# Patient Record
Sex: Female | Born: 1950 | Hispanic: No | Smoking: Never smoker
Health system: Southern US, Community
[De-identification: ages and names within clinical notes are randomized; demographics above are authoritative.]

## PROBLEM LIST (undated history)

## (undated) DIAGNOSIS — I10 Essential (primary) hypertension: Secondary | ICD-10-CM

## (undated) DIAGNOSIS — M1990 Unspecified osteoarthritis, unspecified site: Secondary | ICD-10-CM

## (undated) DIAGNOSIS — R42 Dizziness and giddiness: Secondary | ICD-10-CM

## (undated) HISTORY — DX: Unspecified osteoarthritis, unspecified site: M19.90

## (undated) HISTORY — DX: Dizziness and giddiness: R42

---

## 1989-07-23 HISTORY — PX: BREAST ENHANCEMENT SURGERY: SHX7

## 1999-10-24 ENCOUNTER — Other Ambulatory Visit: Admission: RE | Admit: 1999-10-24 | Discharge: 1999-10-24 | Payer: Self-pay | Admitting: Sports Medicine

## 1999-10-24 ENCOUNTER — Encounter: Admission: RE | Admit: 1999-10-24 | Discharge: 1999-10-24 | Payer: Self-pay | Admitting: Sports Medicine

## 2000-06-12 ENCOUNTER — Encounter: Admission: RE | Admit: 2000-06-12 | Discharge: 2000-06-12 | Payer: Self-pay | Admitting: Family Medicine

## 2000-11-21 ENCOUNTER — Encounter: Admission: RE | Admit: 2000-11-21 | Discharge: 2000-11-21 | Payer: Self-pay | Admitting: Family Medicine

## 2001-01-08 ENCOUNTER — Encounter: Admission: RE | Admit: 2001-01-08 | Discharge: 2001-01-08 | Payer: Self-pay | Admitting: Sports Medicine

## 2001-01-08 ENCOUNTER — Encounter: Payer: Self-pay | Admitting: Sports Medicine

## 2001-01-09 ENCOUNTER — Encounter: Admission: RE | Admit: 2001-01-09 | Discharge: 2001-01-09 | Payer: Self-pay | Admitting: Family Medicine

## 2001-01-09 ENCOUNTER — Other Ambulatory Visit: Admission: RE | Admit: 2001-01-09 | Discharge: 2001-01-09 | Payer: Self-pay | Admitting: Sports Medicine

## 2001-02-20 ENCOUNTER — Encounter: Admission: RE | Admit: 2001-02-20 | Discharge: 2001-02-20 | Payer: Self-pay | Admitting: Family Medicine

## 2001-09-07 ENCOUNTER — Encounter: Payer: Self-pay | Admitting: *Deleted

## 2001-09-07 ENCOUNTER — Emergency Department (HOSPITAL_COMMUNITY): Admission: EM | Admit: 2001-09-07 | Discharge: 2001-09-07 | Payer: Self-pay | Admitting: Emergency Medicine

## 2002-12-01 ENCOUNTER — Encounter: Admission: RE | Admit: 2002-12-01 | Discharge: 2002-12-01 | Payer: Self-pay | Admitting: Family Medicine

## 2002-12-23 ENCOUNTER — Encounter: Admission: RE | Admit: 2002-12-23 | Discharge: 2002-12-23 | Payer: Self-pay | Admitting: Family Medicine

## 2002-12-29 ENCOUNTER — Ambulatory Visit (HOSPITAL_COMMUNITY): Admission: RE | Admit: 2002-12-29 | Discharge: 2002-12-29 | Payer: Self-pay

## 2002-12-29 ENCOUNTER — Encounter: Payer: Self-pay | Admitting: Family Medicine

## 2004-06-05 ENCOUNTER — Ambulatory Visit: Payer: Self-pay | Admitting: Family Medicine

## 2004-07-03 ENCOUNTER — Ambulatory Visit (HOSPITAL_COMMUNITY): Admission: RE | Admit: 2004-07-03 | Discharge: 2004-07-03 | Payer: Self-pay | Admitting: Family Medicine

## 2004-07-26 ENCOUNTER — Ambulatory Visit: Payer: Self-pay | Admitting: Family Medicine

## 2004-08-29 ENCOUNTER — Ambulatory Visit: Payer: Self-pay | Admitting: Family Medicine

## 2005-09-17 ENCOUNTER — Emergency Department (HOSPITAL_COMMUNITY): Admission: EM | Admit: 2005-09-17 | Discharge: 2005-09-17 | Payer: Self-pay | Admitting: Family Medicine

## 2005-10-30 ENCOUNTER — Ambulatory Visit: Payer: Self-pay | Admitting: Family Medicine

## 2005-11-08 ENCOUNTER — Ambulatory Visit (HOSPITAL_COMMUNITY): Admission: RE | Admit: 2005-11-08 | Discharge: 2005-11-08 | Payer: Self-pay | Admitting: Family Medicine

## 2005-11-26 ENCOUNTER — Ambulatory Visit: Payer: Self-pay | Admitting: Family Medicine

## 2005-11-26 ENCOUNTER — Encounter (INDEPENDENT_AMBULATORY_CARE_PROVIDER_SITE_OTHER): Payer: Self-pay | Admitting: *Deleted

## 2005-11-26 LAB — CONVERTED CEMR LAB

## 2006-08-30 ENCOUNTER — Emergency Department (HOSPITAL_COMMUNITY): Admission: EM | Admit: 2006-08-30 | Discharge: 2006-08-30 | Payer: Self-pay | Admitting: Family Medicine

## 2006-09-02 ENCOUNTER — Emergency Department (HOSPITAL_COMMUNITY): Admission: EM | Admit: 2006-09-02 | Discharge: 2006-09-02 | Payer: Self-pay | Admitting: Family Medicine

## 2006-09-19 DIAGNOSIS — N951 Menopausal and female climacteric states: Secondary | ICD-10-CM | POA: Insufficient documentation

## 2006-09-20 ENCOUNTER — Encounter (INDEPENDENT_AMBULATORY_CARE_PROVIDER_SITE_OTHER): Payer: Self-pay | Admitting: *Deleted

## 2007-06-09 ENCOUNTER — Emergency Department (HOSPITAL_COMMUNITY): Admission: EM | Admit: 2007-06-09 | Discharge: 2007-06-09 | Payer: Self-pay | Admitting: Family Medicine

## 2007-06-13 ENCOUNTER — Ambulatory Visit: Payer: Self-pay | Admitting: Family Medicine

## 2007-06-13 DIAGNOSIS — H5789 Other specified disorders of eye and adnexa: Secondary | ICD-10-CM | POA: Insufficient documentation

## 2007-08-20 ENCOUNTER — Ambulatory Visit: Payer: Self-pay | Admitting: Family Medicine

## 2007-08-20 DIAGNOSIS — J019 Acute sinusitis, unspecified: Secondary | ICD-10-CM | POA: Insufficient documentation

## 2007-09-19 ENCOUNTER — Encounter: Payer: Self-pay | Admitting: Family Medicine

## 2007-09-19 ENCOUNTER — Ambulatory Visit: Payer: Self-pay | Admitting: Family Medicine

## 2007-11-21 ENCOUNTER — Emergency Department (HOSPITAL_COMMUNITY): Admission: EM | Admit: 2007-11-21 | Discharge: 2007-11-21 | Payer: Self-pay | Admitting: Emergency Medicine

## 2008-05-27 ENCOUNTER — Emergency Department (HOSPITAL_COMMUNITY): Admission: EM | Admit: 2008-05-27 | Discharge: 2008-05-27 | Payer: Self-pay | Admitting: Emergency Medicine

## 2008-10-28 ENCOUNTER — Encounter: Payer: Self-pay | Admitting: Family Medicine

## 2008-10-28 ENCOUNTER — Ambulatory Visit: Payer: Self-pay | Admitting: Family Medicine

## 2008-10-28 DIAGNOSIS — R42 Dizziness and giddiness: Secondary | ICD-10-CM | POA: Insufficient documentation

## 2008-10-28 HISTORY — DX: Dizziness and giddiness: R42

## 2009-11-10 ENCOUNTER — Encounter: Payer: Self-pay | Admitting: Family Medicine

## 2009-11-10 ENCOUNTER — Ambulatory Visit: Payer: Self-pay | Admitting: Family Medicine

## 2009-11-10 LAB — CONVERTED CEMR LAB
Albumin: 4.4 g/dL (ref 3.5–5.2)
CO2: 26 meq/L (ref 19–32)
Cholesterol: 208 mg/dL — ABNORMAL HIGH (ref 0–200)
Glucose, Bld: 102 mg/dL — ABNORMAL HIGH (ref 70–99)
MCV: 92.9 fL (ref 78.0–100.0)
Potassium: 3.7 meq/L (ref 3.5–5.3)
RBC: 4.34 M/uL (ref 3.87–5.11)
Sodium: 140 meq/L (ref 135–145)
Total Protein: 6.7 g/dL (ref 6.0–8.3)
Triglycerides: 173 mg/dL — ABNORMAL HIGH (ref <150)
WBC: 7 10*3/uL (ref 4.0–10.5)

## 2009-11-14 ENCOUNTER — Encounter: Payer: Self-pay | Admitting: Family Medicine

## 2010-08-22 NOTE — Assessment & Plan Note (Signed)
Summary: cpe,tcb   Vital Signs:  Patient profile:   60 year old female Height:      60.25 inches Weight:      163.8 pounds BMI:     31.84 Temp:     97.9 degrees F oral Pulse rate:   64 / minute BP sitting:   146 / 97  (right arm) Cuff size:   regular  Vitals Entered By: Garen Grams LPN (November 10, 2009 9:18 AM) CC: CPE Is Patient Diabetic? No Pain Assessment Patient in pain? no        Primary Care Provider:  Angelena Sole MD  CC:  CPE.  History of Present Illness: Medical concerns 1. Itchy skin:  Has been like that for years.  Has been using lotions without much help 2. Hot flashes:  Still dealing with hot flashes.  Menopause was around 15 years ago.  Prevention 1. Colonoscopy: never done 2. Mammogram: 3 years ago 3. Pap smear: 2 years ago 4. Lab work: unknown  Habits & Providers  Alcohol-Tobacco-Diet     Tobacco Status: never  Current Medications (verified): 1)  None  Allergies (verified): No Known Drug Allergies  Past History:  Past Medical History: Reviewed history from 09/19/2006 and no changes required. elevated liver enzymes sec to etoh 04/01, [id number], h/o patellofemoral syndrome 04/02, No STD`s, no abnl paps, occasional LE edema  Past Surgical History: Reviewed history from 09/19/2006 and no changes required. breast implants - 07/23/1989, C section - 07/23/1986, TAB X 2 -  Family History: Reviewed history from 09/19/2006 and no changes required. 2 bro, 5 sis in [place]--healthy as far as she knows, Dad alive at 31, healthy, M Uncle with HTN and CVA., Mom died at 61yo of CVA.., Strong fam hx of diabetes (M Aunt, M Uncle)  Social History: Reviewed history from 09/19/2006 and no changes required. Lives w/ 13 yo son Thayer Ohm).  Divorced.  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  No tob, exercises 2 times/week.; Occassional alcohol use.  Review of Systems       The patient complains of weight gain.  The patient denies fever, weight  loss, vision loss, hoarseness, chest pain, syncope, dyspnea on exertion, peripheral edema, prolonged cough, and headaches.    Physical Exam  General:  Obese, awake, alert, no acute distress Head:  normocephalic and atraumatic.   Eyes:  vision grossly intact.  PERRL, EOMI Mouth:  Oral mucosa and oropharynx without lesions or exudates.  Teeth in good repair. Neck:  No deformities, masses, or tenderness noted. Lungs:  Normal respiratory effort, chest expands symmetrically. Lungs are clear to auscultation, no crackles or wheezes. Heart:  normal rate, regular rhythm, and no murmur.   Abdomen:  soft, non-tender, normal bowel sounds, and no distention.   Msk:  normal ROM and no joint tenderness.   Pulses:  2+ DP pulses bilaterally Extremities:  no LE edema Neurologic:  alert & oriented X3, cranial nerves II-XII intact, strength normal in all extremities, and sensation intact to light touch.   Psych:  not depressed appearing.     Impression & Recommendations:  Problem # 1:  PREVENTIVE HEALTH CARE (ICD-V70.0) Assessment Unchanged Will check CMET, CBC, and Lipids.  Will refer for Colonoscopy and Mammogram.  She will need PAP at next visit.  Counseled her on weight loss. Orders: Comp Met-[facility] [id number]) CBC-[facility] ([id number]) [facility] - Est  40-64 yrs ([id number])  Problem # 2:  MENOPAUSAL SYNDROME (ICD-627.2) Assessment: Unchanged Likely cause of dry skin and hot flashes.  Pt would likely benefit from hormone replacement therapy.  Other Orders: Colonoscopy (Colon) Mammogram (Screening) (Mammo) T-Lipid Profile [phone])  Patient Instructions: 1)  It was nice to meet you today 2)  I look forward to taking over your care 3)  I am going to get some screening blood work done today 4)  I have also referred you for a colonscopy and mammogram 5)  You are also due for a PAP smear so please be ready for that at you next office visit 6)  Your blood pressure was elevated today 7)  I would like to see you  back in 4 weeks to go over you lab results, recheck your blood pressure, and to do a PAP smear  Prevention & Chronic Care Immunizations   Influenza vaccine: Not documented    Tetanus booster: 11/26/2005: Done.   Tetanus booster due: 11/27/2015    Pneumococcal vaccine: Not documented  Colorectal Screening   Hemoccult: Not documented    Colonoscopy: Not documented   Colonoscopy action/deferral: GI Referral  (11/10/2009)  Other Screening   Pap smear: normal  (09/29/2007)   Pap smear due: 09/28/2008    Mammogram: Done.  (10/27/2005)   Mammogram action/deferral: Ordered  (11/10/2009)   Mammogram due: 10/28/2006   Smoking status: never  (11/10/2009)  Lipids   Total Cholesterol: Not documented   Lipid panel action/deferral: Lipid Panel ordered   LDL: Not documented   LDL Direct: Not documented   HDL: Not documented   Triglycerides: Not documented   Nursing Instructions: Screening colonoscopy ordered Schedule screening mammogram (see order)

## 2010-08-22 NOTE — Letter (Signed)
Summary: Generic Letter  [facility]  [address]   [place], [place] [address]   Phone: [phone]  Fax: [phone]    11/14/2009  [facility] [address] [place], [place]  [address]  Dear Ms. Yu,  Here is a copy of your lab results.  Overall everything looked pretty good.  Your blood counts, kidney function, and liver function were all normal.  Your bad cholestero (LDL) was just a little high.  I would like for it to be less than 130 ideally.  I don't think that it is high enough to start a medicine.  I do think that it would help if you start taking a Fish Oil supplement.  Please call the office if you have any questions.  Tests: (1) CBC NO Diff (Complete Blood Count) (10000)   Order Note: FASTING   WBC                       7.0 K/uL                    4.0-10.5   RBC                       4.34 MIL/uL                 3.87-5.11   Hemoglobin                13.6 g/dL                   57.8-46.9   Hematocrit                40.3 %                      36.0-46.0   MCV                       92.9 fL                     78.0-100.0 ! MCH                       31.3 pg                     26.0-34.0   MCHC                      33.7 g/dL                   62.9-52.8   RDW                       12.1 %                      11.5-15.5   Platelet Count            234 K/uL                    150-400  Tests: (2) Comprehensive Metabolic Panel (41324)   Sodium                    140 mEq/L                   135-145   Potassium  3.7 mEq/L                   3.5-5.3   Chloride                  105 mEq/L                   96-112   CO2                       26 mEq/L                    19-32   Glucose              [H]  102 mg/dL                   16-10   BUN                       13 mg/dL                    9-60   Creatinine                0.75 mg/dL                  0.40-1.20   Bilirubin, Total          0.8 mg/dL                   4.5-4.0   Alkaline  Phosphatase      73 U/L                      39-117   AST/SGOT                  20 U/L                      0-37   ALT/SGPT                  20 U/L                      0-35   Total Protein             6.7 g/dL                    9.8-1.1   Albumin                   4.4 g/dL                    9.1-4.7   Calcium                   8.6 mg/dL                   8.2-95.6  Tests: (3) Lipid Profile (21308)   Cholesterol          [H]  208 mg/dL                   6-578     ATP III Classification:           < 200        mg/dL        Desirable          200 - 239  mg/dL        Borderline High          >= 240        mg/dL        High         Triglyceride         [H]  173 mg/dL                   <462   HDL Cholesterol           41 mg/dL                    >70   Total Chol/HDL Ratio      5.1 Ratio  VLDL Cholesterol (Calc)                             35 mg/dL                    3-50  LDL Cholesterol (Calc)                        [H]  132 mg/dL                   0-93           Total Cholesterol/HDL Ratio:CHD Risk                            Coronary Heart Disease Risk Table                                            Men       Women              1/2 Average Risk              3.4        3.3                  Average Risk              5.0        4.4              2 X Average Risk              9.6        7.1              3 X Average Risk             23.4       11.0     Use the calculated Patient Ratio above and the CHD Risk table      to determine the patient's CHD Risk.     ATP III Classification (LDL):           < 100        mg/dL         Optimal          100 - 129     mg/dL         Near or Above Optimal          130 - 159     mg/dL         Borderline High  160 - 189     mg/dL         High           > 190        mg/dL         Very High         Sincerely,   Angelena Sole MD  Appended Document: Generic Letter mailed.

## 2010-08-31 ENCOUNTER — Inpatient Hospital Stay (INDEPENDENT_AMBULATORY_CARE_PROVIDER_SITE_OTHER)
Admission: RE | Admit: 2010-08-31 | Discharge: 2010-08-31 | Disposition: A | Discharge status: Home / Self Care | Payer: Commercial Managed Care - PPO | Source: Ambulatory Visit | Attending: Family Medicine | Admitting: Family Medicine

## 2010-08-31 DIAGNOSIS — J069 Acute upper respiratory infection, unspecified: Secondary | ICD-10-CM

## 2011-11-22 ENCOUNTER — Other Ambulatory Visit (HOSPITAL_COMMUNITY)
Admission: RE | Admit: 2011-11-22 | Discharge: 2011-11-22 | Disposition: A | Discharge status: Home / Self Care | Payer: 59 | Source: Ambulatory Visit | Attending: Family Medicine | Admitting: Family Medicine

## 2011-11-22 ENCOUNTER — Encounter: Payer: Self-pay | Admitting: Family Medicine

## 2011-11-22 ENCOUNTER — Ambulatory Visit (INDEPENDENT_AMBULATORY_CARE_PROVIDER_SITE_OTHER): Payer: Commercial Managed Care - PPO | Admitting: Family Medicine

## 2011-11-22 VITALS — BP 140/90 | HR 76 | Temp 98.2°F | Ht 61.0 in | Wt 168.0 lb

## 2011-11-22 DIAGNOSIS — Z124 Encounter for screening for malignant neoplasm of cervix: Secondary | ICD-10-CM

## 2011-11-22 DIAGNOSIS — R030 Elevated blood-pressure reading, without diagnosis of hypertension: Secondary | ICD-10-CM

## 2011-11-22 DIAGNOSIS — R635 Abnormal weight gain: Secondary | ICD-10-CM | POA: Insufficient documentation

## 2011-11-22 DIAGNOSIS — IMO0001 Reserved for inherently not codable concepts without codable children: Secondary | ICD-10-CM | POA: Insufficient documentation

## 2011-11-22 DIAGNOSIS — N951 Menopausal and female climacteric states: Secondary | ICD-10-CM

## 2011-11-22 DIAGNOSIS — Z01419 Encounter for gynecological examination (general) (routine) without abnormal findings: Secondary | ICD-10-CM | POA: Insufficient documentation

## 2011-11-22 NOTE — Patient Instructions (Signed)
Michelle Cruz,  Thank you for coming to see me today.   I will call or send a letter with the results of your blood work and pap smear. Please call and schedule your mammogram and colonoscopy.  Please start a daily multivitamin for hair and skin maintenance with biotin.   Dr. Armen Pickup

## 2011-11-22 NOTE — Progress Notes (Signed)
Patient ID: Michelle Cruz, female   DOB: 01/19/1951, 61 y.o.   MRN: [id number] Subjective:     Michelle Cruz is a 61 y.o. female and is here for a comprehensive physical exam. The patient reports problems - dry skin in her scalp, temples, inguinal canal.  Elevated fasting blood glucose to 115 at a health screening 3 months ago. Elevated BP to 134/88 at the same screening.  Hardness in her L breast. Denies pain or nipple discharge. Has breast augmentation in 1991.  Screening: never had colonoscopy. No history of abnormal pap smears. No history of abnormal mammogram.   History   Social History  . Marital Status: Divorced    Spouse Name: N/A    Number of Children: 1  . Years of Education: N/A   Occupational History  . Medical Records  [employer]   Social History Main Topics  . Smoking status: Never Smoker   . Smokeless tobacco: Never Used  . Alcohol Use: 0.6 oz/week    1 Glasses of wine per week  . Drug Use: No  . Sexually Active: Not Currently    Birth Control/ Protection: None   Other Topics Concern  . Not on file   Social History Narrative   Lives with a roomate   Health Maintenance  Topic Date Due  . Pap Smear  06/30/1970  . Colonoscopy  06/30/2002  . Mammogram  11/09/2007  . Influenza Vaccine  04/22/2012  . Tetanus/tdap  11/27/2015   Review of Systems Constitutional: negative for anorexia, chills, fatigue, fevers, malaise, night sweats and weight loss Eyes: negative Ears, nose, mouth, throat, and face: negative Respiratory: negative Cardiovascular: negative Gastrointestinal: negative for abdominal pain, change in bowel habits, constipation, diarrhea, dyspepsia and melena Genitourinary:negative Integument/breast: positive for dryness. Positive for L breast hardness.  Hematologic/lymphatic: negative Musculoskeletal:negative Neurological: positive for headaches  Behavioral/Psych: negative Endocrine: negative for diabetic symptoms including polydipsia,  polyphagia and polyuria. Positive for dry skin and hot flashes.  Allergic/Immunologic: negative  Objective:    BP 140/90  Pulse 76  Temp(Src) 98.2 F (36.8 C) (Oral)  Ht 5\' 1"  (1.549 m)  Wt 168 lb (76.204 kg)  BMI 31.74 kg/m2 General appearance: alert, cooperative and no distress Head: Normocephalic, without obvious abnormality, atraumatic Eyes: conjunctivae/corneas clear. PERRL, EOM's intact.  Ears: normal TM's and external ear canals both ears Throat: lips, mucosa, and tongue normal; teeth and gums normal Neck: no adenopathy, no carotid bruit, no JVD, supple, symmetrical, trachea midline and thyroid not enlarged, symmetric, no tenderness/mass/nodules Back: symmetric, no curvature. ROM normal. No CVA tenderness. Lungs: clear to auscultation bilaterally Breasts: normal appearance, no masses or tenderness L breast hard on palpation compared to R.  Heart: regular rate and rhythm, S1, S2 normal, no murmur, click, rub or gallop Pelvic: cervix normal in appearance, external genitalia normal, no adnexal masses or tenderness, no cervical motion tenderness, perianal skin: no external genital warts noted, positive findings: perianal skin tags. Fecal occult blood postive. , rectovaginal septum normal, uterus normal size, shape, and consistency and vagina normal without discharge Extremities: extremities normal, atraumatic, no cyanosis or edema Skin: dry skin in scalp, ears and temples. With some flaking and erythema. Neurologic: Grossly normal    Assessment:    Healthy female exam. Elevated BP and elevated fasting blood sugar at health fair. Dry skin concerning for menopausal changes.      Plan:  Check fasting lipids, TSH and CMET. Pap done. Screening mammogram. FOBT + will obtain diagnostic colonoscopy.  See After  Visit Summary for Counseling Recommendations

## 2011-11-22 NOTE — Assessment & Plan Note (Signed)
Check lipids and fasting blood glucose,

## 2011-11-22 NOTE — Assessment & Plan Note (Signed)
Check TSH and electrolytes (K, Cr). Will follow. Advised weight loss.

## 2011-11-22 NOTE — Assessment & Plan Note (Signed)
Dry skin and hot flashes consistent with menopausal changes. Discussed starting Effexor. Patient declines medications and chooses to cope with the symptoms.

## 2011-11-23 ENCOUNTER — Other Ambulatory Visit: Payer: Commercial Managed Care - PPO

## 2011-11-23 DIAGNOSIS — IMO0001 Reserved for inherently not codable concepts without codable children: Secondary | ICD-10-CM

## 2011-11-23 DIAGNOSIS — R635 Abnormal weight gain: Secondary | ICD-10-CM

## 2011-11-23 LAB — LIPID PANEL
HDL: 42 mg/dL (ref >39)
LDL Cholesterol: 160 mg/dL — ABNORMAL HIGH (ref 0–99)
Triglycerides: 102 mg/dL (ref <150)
VLDL: 20 mg/dL (ref 0–40)

## 2011-11-23 LAB — COMPREHENSIVE METABOLIC PANEL
ALT: 25 U/L (ref 0–35)
AST: 23 U/L (ref 0–37)
Creat: 0.66 mg/dL (ref 0.50–1.10)
Total Bilirubin: 0.8 mg/dL (ref 0.3–1.2)

## 2011-11-23 NOTE — Progress Notes (Signed)
CMP,FLP AND TSH DONE TODAY Michelle Cruz 

## 2011-11-30 ENCOUNTER — Telehealth: Payer: Self-pay | Admitting: Family Medicine

## 2011-11-30 ENCOUNTER — Encounter: Payer: Self-pay | Admitting: Family Medicine

## 2011-11-30 DIAGNOSIS — E7800 Pure hypercholesterolemia, unspecified: Secondary | ICD-10-CM

## 2011-11-30 DIAGNOSIS — R7301 Impaired fasting glucose: Secondary | ICD-10-CM | POA: Insufficient documentation

## 2011-11-30 NOTE — Assessment & Plan Note (Signed)
P:  As per patient letter Recheck A1c and fasting blood sugar in 3-6 months. In the meantime advised pt to limit carbs to one serving per meal, increase lean protein, veggies and exercise.

## 2011-11-30 NOTE — Telephone Encounter (Signed)
Message copied by Dessa Phi on Fri Nov 30, 2011  1:54 PM ------      Message from: Barbaraann Barthel      Created: Mon Nov 26, 2011  2:11 PM                   ----- Message -----         From: [facility] Interface         Sent: 11/23/2011   6:31 PM           To: Barbaraann Barthel, MD

## 2011-11-30 NOTE — Telephone Encounter (Signed)
Left VM. Informed pt of elevated cholesterol and impaired. Informed patient that I sent a letter detailing plan of care to her.

## 2011-11-30 NOTE — Assessment & Plan Note (Signed)
A: decline P: -limit intake of total cholesterol -exercise

## 2011-12-05 ENCOUNTER — Encounter: Payer: Self-pay | Admitting: Family Medicine

## 2011-12-07 ENCOUNTER — Encounter: Payer: Self-pay | Admitting: Family Medicine

## 2012-12-02 ENCOUNTER — Encounter: Payer: Self-pay | Admitting: Internal Medicine

## 2012-12-02 ENCOUNTER — Other Ambulatory Visit: Payer: Self-pay | Admitting: Internal Medicine

## 2012-12-02 DIAGNOSIS — Z1231 Encounter for screening mammogram for malignant neoplasm of breast: Secondary | ICD-10-CM

## 2012-12-29 ENCOUNTER — Encounter: Payer: 59 | Admitting: Internal Medicine

## 2013-01-05 ENCOUNTER — Ambulatory Visit
Admission: RE | Admit: 2013-01-05 | Discharge: 2013-01-05 | Disposition: A | Discharge status: Home / Self Care | Payer: 59 | Source: Ambulatory Visit | Attending: Internal Medicine | Admitting: Internal Medicine

## 2013-01-05 DIAGNOSIS — Z1231 Encounter for screening mammogram for malignant neoplasm of breast: Secondary | ICD-10-CM

## 2014-02-24 ENCOUNTER — Encounter: Payer: Self-pay | Admitting: Internal Medicine

## 2014-04-05 ENCOUNTER — Other Ambulatory Visit: Payer: Self-pay | Admitting: Internal Medicine

## 2014-04-05 DIAGNOSIS — Z1231 Encounter for screening mammogram for malignant neoplasm of breast: Secondary | ICD-10-CM

## 2014-04-19 ENCOUNTER — Ambulatory Visit (AMBULATORY_SURGERY_CENTER): Payer: Self-pay | Admitting: *Deleted

## 2014-04-19 VITALS — Ht 61.0 in | Wt 173.8 lb

## 2014-04-19 DIAGNOSIS — Z1211 Encounter for screening for malignant neoplasm of colon: Secondary | ICD-10-CM

## 2014-04-19 MED ORDER — MOVIPREP 100 G PO SOLR: ORAL | Status: DC

## 2014-04-19 NOTE — Progress Notes (Signed)
No allergies to eggs or soy. No problems with anesthesia.  Pt given Emmi instructions for colonoscopy  No oxygen use  No diet drug use  

## 2014-04-28 ENCOUNTER — Encounter: Payer: 59 | Admitting: Internal Medicine

## 2014-04-29 ENCOUNTER — Encounter: Payer: Self-pay | Admitting: Internal Medicine

## 2014-04-29 ENCOUNTER — Ambulatory Visit (AMBULATORY_SURGERY_CENTER): Payer: 59 | Admitting: Internal Medicine

## 2014-04-29 VITALS — BP 149/88 | HR 66 | Temp 97.0°F | Resp 14 | Ht 61.0 in | Wt 173.0 lb

## 2014-04-29 DIAGNOSIS — D125 Benign neoplasm of sigmoid colon: Secondary | ICD-10-CM

## 2014-04-29 DIAGNOSIS — Z1211 Encounter for screening for malignant neoplasm of colon: Secondary | ICD-10-CM

## 2014-04-29 MED ORDER — SODIUM CHLORIDE 0.9 % IV SOLN
500.0000 mL | INTRAVENOUS | Status: DC
Start: 2014-04-29 — End: 2014-04-29

## 2014-04-29 NOTE — Op Note (Signed)
[facility]  [address] [place], [address]   COLONOSCOPY PROCEDURE REPORT  PATIENT: Michelle Cruz, Michelle Cruz  MR#: [id number] BIRTHDATE: 05-27-1951 , 62  yrs. old GENDER: female ENDOSCOPIST: Eustace Quail, MD REFERRED KN:[id number] Ardeth Perfect, M.D. PROCEDURE DATE:  04/29/2014 PROCEDURE:   Colonoscopy with snare polypectomy x 2 First Screening Colonoscopy - Avg.  risk and is 50 yrs.  old or older Yes.  Prior Negative Screening - Now for repeat screening. N/A  History of Adenoma - Now for follow-up colonoscopy & has been > or = to 3 yrs.  N/A  Polyps Removed Today? Yes. ASA CLASS:   Class I INDICATIONS:average risk for colorectal cancer. MEDICATIONS: Monitored anesthesia care and Propofol 200 mg IV  DESCRIPTION OF PROCEDURE:   After the risks benefits and alternatives of the procedure were thoroughly explained, informed consent was obtained.  The digital rectal exam revealed no abnormalities of the rectum.   The [id number]  endoscope was introduced through the anus and advanced to the cecum, which was identified by both the appendix and ileocecal valve. No adverse events experienced.   The quality of the prep was excellent, using MoviPrep  The instrument was then slowly withdrawn as the colon was fully examined.      COLON FINDINGS: Two polyps measuring 5 mm in size were found in the sigmoid colon.  A polypectomy was performed with a cold snare.  The resection was complete, the polyp tissue was completely retrieved and sent to histology.   The examination was otherwise normal. Retroflexed views revealed internal hemorrhoids. The time to cecum=2 minutes 55 seconds.  Withdrawal time=13 minutes 26 seconds. The scope was withdrawn and the procedure completed. COMPLICATIONS: There were no immediate complications.  ENDOSCOPIC IMPRESSION: 1.   Two polyps measuring 5 mm in size were found in the sigmoid colon; polypectomy was performed with a cold snare 2.    The examination was otherwise normal  RECOMMENDATIONS: Follow up colonoscopy in 5 years  eSigned:  Eustace Quail, MD 04/29/2014 8:51 AM   cc: The Patient    ; Velna Hatchet, MD

## 2014-04-29 NOTE — Progress Notes (Signed)
A/ox3 pleased with MAC, report to Sheila RN 

## 2014-04-29 NOTE — Patient Instructions (Signed)
YOU HAD AN ENDOSCOPIC PROCEDURE TODAY AT THE [facility]: Refer to the procedure report that was given to you for any specific questions about what was found during the examination.  If the procedure report does not answer your questions, please call your gastroenterologist to clarify.  If you requested that your care partner not be given the details of your procedure findings, then the procedure report has been included in a sealed envelope for you to review at your convenience later.  YOU SHOULD EXPECT: Some feelings of bloating in the abdomen. Passage of more gas than usual.  Walking can help get rid of the air that was put into your GI tract during the procedure and reduce the bloating. If you had a lower endoscopy (such as a colonoscopy or flexible sigmoidoscopy) you may notice spotting of blood in your stool or on the toilet paper. If you underwent a bowel prep for your procedure, then you may not have a normal bowel movement for a few days.  DIET: Your first meal following the procedure should be a light meal and then it is ok to progress to your normal diet.  A half-sandwich or bowl of soup is an example of a good first meal.  Heavy or fried foods are harder to digest and may make you feel nauseous or bloated.  Likewise meals heavy in dairy and vegetables can cause extra gas to form and this can also increase the bloating.  Drink plenty of fluids but you should avoid alcoholic beverages for 24 hours.  ACTIVITY: Your care partner should take you home directly after the procedure.  You should plan to take it easy, moving slowly for the rest of the day.  You can resume normal activity the day after the procedure however you should NOT DRIVE or use heavy machinery for 24 hours (because of the sedation medicines used during the test).    SYMPTOMS TO REPORT IMMEDIATELY: A gastroenterologist can be reached at any hour.  During normal business hours, 8:30 AM to 5:00 PM Monday through Friday,  call [phone].  After hours and on weekends, please call the GI answering service at [phone] who will take a message and have the physician on call contact you.   Following lower endoscopy (colonoscopy or flexible sigmoidoscopy):  Excessive amounts of blood in the stool  Significant tenderness or worsening of abdominal pains  Swelling of the abdomen that is new, acute  Fever of 100F or higher  FOLLOW UP: If any biopsies were taken you will be contacted by phone or by letter within the next 1-3 weeks.  Call your gastroenterologist if you have not heard about the biopsies in 3 weeks.  Our staff will call the home number listed on your records the next business day following your procedure to check on you and address any questions or concerns that you may have at that time regarding the information given to you following your procedure. This is a courtesy call and so if there is no answer at the home number and we have not heard from you through the emergency physician on call, we will assume that you have returned to your regular daily activities without incident.  SIGNATURES/CONFIDENTIALITY: You and/or your care partner have signed paperwork which will be entered into your electronic medical record.  These signatures attest to the fact that that the information above on your After Visit Summary has been reviewed and is understood.  Full responsibility of the confidentiality of this   discharge information lies with you and/or your care-partner.    Resume medications. Information given on polyps with discharge instructions. 

## 2014-04-29 NOTE — Progress Notes (Signed)
Called to room to assist during endoscopic procedure.  Patient ID and intended procedure confirmed with present staff. Received instructions for my participation in the procedure from the performing physician.  

## 2014-04-30 ENCOUNTER — Telehealth: Payer: Self-pay

## 2014-04-30 NOTE — Telephone Encounter (Signed)
Left message on answering machine. 

## 2014-05-04 ENCOUNTER — Ambulatory Visit
Admission: RE | Admit: 2014-05-04 | Discharge: 2014-05-04 | Disposition: A | Discharge status: Home / Self Care | Payer: 59 | Source: Ambulatory Visit | Attending: Internal Medicine | Admitting: Internal Medicine

## 2014-05-04 DIAGNOSIS — Z1231 Encounter for screening mammogram for malignant neoplasm of breast: Secondary | ICD-10-CM

## 2014-05-05 ENCOUNTER — Encounter: Payer: Self-pay | Admitting: Internal Medicine

## 2015-08-09 DIAGNOSIS — Z6834 Body mass index (BMI) 34.0-34.9, adult: Secondary | ICD-10-CM | POA: Diagnosis not present

## 2015-08-09 DIAGNOSIS — M25511 Pain in right shoulder: Secondary | ICD-10-CM | POA: Diagnosis not present

## 2015-08-09 DIAGNOSIS — I10 Essential (primary) hypertension: Secondary | ICD-10-CM | POA: Diagnosis not present

## 2015-08-31 MED FILL — LISINOPRIL 10 MG TABLET: 10 | 90 days supply | Qty: 90 | Fill #0

## 2015-09-12 DIAGNOSIS — H524 Presbyopia: Secondary | ICD-10-CM | POA: Diagnosis not present

## 2015-10-18 MED FILL — LISINOPRIL 20 MG TABLET: 20 | 30 days supply | Qty: 30 | Fill #0

## 2015-11-15 MED FILL — LISINOPRIL 20 MG TABLET: 20 | 30 days supply | Qty: 30 | Fill #1

## 2015-12-05 MED FILL — LISINOPRIL 20 MG TABLET: 20 | 30 days supply | Qty: 30 | Fill #2

## 2016-01-13 MED FILL — LISINOPRIL 20 MG TABLET: 20 | 30 days supply | Qty: 30 | Fill #3

## 2016-02-14 MED FILL — LISINOPRIL 20 MG TABLET: 20 | 30 days supply | Qty: 30 | Fill #4

## 2016-03-13 MED FILL — LISINOPRIL 20 MG TABLET: 20 | 30 days supply | Qty: 30 | Fill #0

## 2016-03-21 DIAGNOSIS — Z0000 Encounter for general adult medical examination without abnormal findings: Secondary | ICD-10-CM | POA: Diagnosis not present

## 2016-03-21 DIAGNOSIS — R7309 Other abnormal glucose: Secondary | ICD-10-CM | POA: Diagnosis not present

## 2016-04-03 DIAGNOSIS — M25562 Pain in left knee: Secondary | ICD-10-CM | POA: Diagnosis not present

## 2016-04-03 DIAGNOSIS — Z6835 Body mass index (BMI) 35.0-35.9, adult: Secondary | ICD-10-CM | POA: Diagnosis not present

## 2016-04-03 DIAGNOSIS — R7309 Other abnormal glucose: Secondary | ICD-10-CM | POA: Diagnosis not present

## 2016-04-03 DIAGNOSIS — Z0000 Encounter for general adult medical examination without abnormal findings: Secondary | ICD-10-CM | POA: Diagnosis not present

## 2016-04-03 DIAGNOSIS — I10 Essential (primary) hypertension: Secondary | ICD-10-CM | POA: Diagnosis not present

## 2016-04-03 DIAGNOSIS — E784 Other hyperlipidemia: Secondary | ICD-10-CM | POA: Diagnosis not present

## 2016-04-03 DIAGNOSIS — Z1389 Encounter for screening for other disorder: Secondary | ICD-10-CM | POA: Diagnosis not present

## 2016-04-03 MED FILL — LISINOPRIL-HCTZ 20-12.5 MG: 20-12.5 | 90 days supply | Qty: 90 | Fill #0

## 2016-05-28 DIAGNOSIS — M25562 Pain in left knee: Secondary | ICD-10-CM | POA: Diagnosis not present

## 2016-05-28 DIAGNOSIS — Z6834 Body mass index (BMI) 34.0-34.9, adult: Secondary | ICD-10-CM | POA: Diagnosis not present

## 2016-05-28 DIAGNOSIS — I10 Essential (primary) hypertension: Secondary | ICD-10-CM | POA: Diagnosis not present

## 2016-07-03 MED FILL — LISINOPRIL-HCTZ 20-12.5 MG: 20-12.5 | 90 days supply | Qty: 90 | Fill #1

## 2016-10-01 MED FILL — LISINOPRIL-HCTZ 20-12.5 MG: 20-12.5 | 90 days supply | Qty: 90 | Fill #2

## 2016-10-27 ENCOUNTER — Encounter (HOSPITAL_COMMUNITY): Payer: Self-pay | Admitting: Family Medicine

## 2016-10-27 ENCOUNTER — Ambulatory Visit (HOSPITAL_COMMUNITY)
Admission: EM | Admit: 2016-10-27 | Discharge: 2016-10-27 | Disposition: A | Discharge status: Home / Self Care | Payer: 59 | Attending: Internal Medicine | Admitting: Internal Medicine

## 2016-10-27 DIAGNOSIS — L409 Psoriasis, unspecified: Secondary | ICD-10-CM | POA: Diagnosis not present

## 2016-10-27 HISTORY — DX: Essential (primary) hypertension: I10

## 2016-10-27 MED ORDER — PREDNISONE 20 MG PO TABS: 20.0000 mg | ORAL_TABLET | Freq: Every day | ORAL | 0 refills | Status: DC

## 2016-10-27 MED ORDER — HYDROCORTISONE 1 % EX OINT: 1.0000 "application " | TOPICAL_OINTMENT | Freq: Two times a day (BID) | CUTANEOUS | 0 refills | Status: AC

## 2016-10-27 MED ORDER — HYDROCORTISONE 1 % EX OINT
1.0000 | TOPICAL_OINTMENT | Freq: Two times a day (BID) | CUTANEOUS | 0 refills | Status: DC
Start: 2016-10-27 — End: 2016-10-27

## 2016-10-27 MED ORDER — CLOBETASOL PROPIONATE 0.05 % EX SHAM: 1.0000 "application " | MEDICATED_SHAMPOO | CUTANEOUS | 0 refills | Status: AC

## 2016-10-27 NOTE — Discharge Instructions (Addendum)
Rash around eyes is consistent with psoriasis.  A prednisone taper to take by mouth was sent to the pharmacy.  Also, some topical steroid prescriptions:  A low potency steroid ointment is safest to use on the face.  A high potency steroid shampoo is safe to use on the scalp. Followup with your primary care provider or dermatologist to discuss continued management of psoriasis.

## 2016-10-27 NOTE — ED Triage Notes (Signed)
Pt here for redness and swelling around both of her eyes x 2 weeks. sts burning.

## 2016-10-27 NOTE — ED Provider Notes (Signed)
[name]    CSN: [id number] Arrival date & time: 10/27/16  1337     History   Chief Complaint Chief Complaint  Patient presents with  . Facial Swelling    HPI Michelle Cruz is a 66 y.o. female. She presents today with a couple weeks history of dry red patches on her upper cheeks and around her eyes. Started with a patch on the right cheek.  Now also affects the upper eyelids bilaterally. Also has some patches at either end of her forehead at the hairline. History of psoriasis. Not currently using any treatment for this.    HPI  Past Medical History:  Diagnosis Date  . Arthritis   . Hypertension   . VERTIGO 10/28/2008   Qualifier: Diagnosis of  By: Erin Hearing MD, [facility]      Patient Active Problem List   Diagnosis Date Noted  . Impaired fasting glucose 11/30/2011    Class: Acute  . Hypercholesteremia 11/30/2011    Class: Chronic  . Elevated BP 11/22/2011  . Weight gain 11/22/2011  . MENOPAUSAL SYNDROME 09/19/2006    Past Surgical History:  Procedure Laterality Date  . BREAST ENHANCEMENT SURGERY  1991  . CESAREAN SECTION  1988     Home Medications    Prior to Admission medications   Medication Sig Start Date End Date Taking? Authorizing Provider  Clobetasol Propionate 0.05 % shampoo Apply 1 application topically 2 (two) times a week. 10/29/16   Sherlene Shams, MD  hydrocortisone 1 % ointment Apply 1 application topically 2 (two) times daily. 10/27/16   Sherlene Shams, MD  ibuprofen (ADVIL,MOTRIN) 200 MG tablet Take 200 mg by mouth every 6 (six) hours as needed.    Historical Provider, MD  predniSONE (DELTASONE) 20 MG tablet Take 1 tablet (20 mg total) by mouth daily. 3 tabs qd x 3d then 2 tabs qd x 3d then 1 tab qd x 3d then 0.5 tab qd x 4d then stop. 10/27/16   Sherlene Shams, MD    Family History Family History  Problem Relation Age of Onset  . Hypertension Maternal Aunt   . Diabetes Maternal Aunt   . Cancer Neg Hx   . Colon cancer Neg Hx    . Esophageal cancer Neg Hx   . Pancreatic cancer Neg Hx   . Rectal cancer Neg Hx   . Stomach cancer Neg Hx     Social History Social History  Substance Use Topics  . Smoking status: Never Smoker  . Smokeless tobacco: Never Used  . Alcohol use 1.2 oz/week    2 Glasses of wine per week     Allergies   Patient has no known allergies.   Review of Systems Review of Systems  All other systems reviewed and are negative.    Physical Exam Triage Vital Signs ED Triage Vitals [10/27/16 1455]  Enc Vitals Group     BP 135/79     Pulse Rate 66     Resp 18     Temp 98.5 F (36.9 C)     Temp src      SpO2 98 %     Weight      Height      Pain Score      Pain Loc    Updated Vital Signs BP 135/79   Pulse 66   Temp 98.5 F (36.9 C)   Resp 18   SpO2 98%   Physical Exam  Constitutional: She is oriented to  person, place, and time. No distress.  HENT:  Head: Atraumatic.  Eyes:  Conjugate gaze observed, no eye redness/discharge  Neck: Neck supple.  Cardiovascular: Normal rate.   Pulmonary/Chest: No respiratory distress.  Abdominal: She exhibits no distension.  Musculoskeletal: Normal range of motion.  Neurological: She is alert and oriented to person, place, and time.  Skin: Skin is warm and dry.  Deep red patches, many with thick adherent white scale, behind the ears, at the nape of the neck, and either end of the forehead in the hairline. On the face, roughly symmetric raised red plaques on the upper cheeks, and involving the upper eyelids bilaterally, minimal scale on the face.  Plaques have a sharp border.  Nursing note and vitals reviewed.    UC Treatments / Results   Procedures Procedures (including critical care time) None today  Final Clinical Impressions(s) / UC Diagnoses   Final diagnoses:  Psoriasis   Rash around eyes is consistent with psoriasis.  A prednisone taper to take by mouth was sent to the pharmacy.  Also, some topical steroid  prescriptions:  A low potency steroid ointment is safest to use on the face.  A high potency steroid shampoo is safe to use on the scalp. Followup with your primary care provider or dermatologist to discuss continued management of psoriasis.    New Prescriptions New Prescriptions   CLOBETASOL PROPIONATE 0.05 % SHAMPOO    Apply 1 application topically 2 (two) times a week.   HYDROCORTISONE 1 % OINTMENT    Apply 1 application topically 2 (two) times daily.   PREDNISONE (DELTASONE) 20 MG TABLET    Take 1 tablet (20 mg total) by mouth daily. 3 tabs qd x 3d then 2 tabs qd x 3d then 1 tab qd x 3d then 0.5 tab qd x 4d then stop.     Sherlene Shams, MD 10/28/16 [phone]

## 2016-10-29 MED FILL — CLOBETASOL 0.05% SHAMPOO: 0.05 | 20 days supply | Qty: 118 | Fill #0

## 2016-11-10 DIAGNOSIS — H524 Presbyopia: Secondary | ICD-10-CM | POA: Diagnosis not present

## 2016-11-12 DIAGNOSIS — L210 Seborrhea capitis: Secondary | ICD-10-CM | POA: Diagnosis not present

## 2016-11-12 DIAGNOSIS — Z6835 Body mass index (BMI) 35.0-35.9, adult: Secondary | ICD-10-CM | POA: Diagnosis not present

## 2016-11-12 DIAGNOSIS — L309 Dermatitis, unspecified: Secondary | ICD-10-CM | POA: Diagnosis not present

## 2016-11-12 DIAGNOSIS — L710 Perioral dermatitis: Secondary | ICD-10-CM | POA: Diagnosis not present

## 2016-11-12 MED FILL — DOXYCYCLINE HYCLATE 100 MG: 100 | 14 days supply | Qty: 28 | Fill #0

## 2017-01-04 MED FILL — LISINOPRIL-HCTZ 20-12.5 MG: 20-12.5 | 90 days supply | Qty: 90 | Fill #3

## 2017-01-29 DIAGNOSIS — M25562 Pain in left knee: Secondary | ICD-10-CM | POA: Diagnosis not present

## 2017-01-29 DIAGNOSIS — Z6834 Body mass index (BMI) 34.0-34.9, adult: Secondary | ICD-10-CM | POA: Diagnosis not present

## 2017-01-29 MED FILL — IBUPROFEN 600 MG TABLET: 600 | 20 days supply | Qty: 60 | Fill #0

## 2017-01-29 MED FILL — METHOCARBAMOL 500 MG TABLET: 500 | 30 days supply | Qty: 60 | Fill #0

## 2017-02-04 ENCOUNTER — Ambulatory Visit (INDEPENDENT_AMBULATORY_CARE_PROVIDER_SITE_OTHER): Payer: Self-pay | Admitting: Orthopaedic Surgery

## 2017-02-11 ENCOUNTER — Ambulatory Visit (INDEPENDENT_AMBULATORY_CARE_PROVIDER_SITE_OTHER): Payer: 59

## 2017-02-11 ENCOUNTER — Ambulatory Visit (INDEPENDENT_AMBULATORY_CARE_PROVIDER_SITE_OTHER): Payer: 59 | Admitting: Orthopaedic Surgery

## 2017-02-11 ENCOUNTER — Encounter (INDEPENDENT_AMBULATORY_CARE_PROVIDER_SITE_OTHER): Payer: Self-pay | Admitting: Orthopaedic Surgery

## 2017-02-11 DIAGNOSIS — M25562 Pain in left knee: Secondary | ICD-10-CM | POA: Diagnosis not present

## 2017-02-11 DIAGNOSIS — G8929 Other chronic pain: Secondary | ICD-10-CM | POA: Diagnosis not present

## 2017-02-11 MED ORDER — DICLOFENAC SODIUM 1 % TD GEL: 2.0000 g | Freq: Four times a day (QID) | TRANSDERMAL | 5 refills | Status: AC

## 2017-02-11 MED FILL — DICLOFENAC SODIUM 1% GEL: 1 | 13 days supply | Qty: 100 | Fill #0

## 2017-02-11 NOTE — Progress Notes (Signed)
Office Visit Note   Patient: Michelle Cruz           Date of Birth: August 18, 1950           MRN: [id number] Visit Date: 02/11/2017              Requested by: Velna Hatchet, [address], [place] [address] PCP: System, Provider Not In   Assessment & Plan: Visit Diagnoses:  1. Chronic pain of left knee     Plan: Overall impression is hamstring tendinitis. I offered the patient and injection which she declined. We'll try Voltaren gel for now. Questions encouraged and answered. Follow-up as needed.  Follow-Up Instructions: Return if symptoms worsen or fail to improve.   Orders:  Orders Placed This Encounter  Procedures  . XR KNEE 3 VIEW LEFT   Meds ordered this encounter  Medications  . diclofenac sodium (VOLTAREN) 1 % GEL    Sig: Apply 2 g topically 4 (four) times daily.    Dispense:  1 Tube    Refill:  5      Procedures: No procedures performed   Clinical Data: No additional findings.   Subjective: Chief Complaint  Patient presents with  . Left Knee - Pain    Patient is a 66 year old female who comes in with left knee pain for about 2-3 weeks. She feels a squeezing sensation on the posterior medial aspect of the knee. She denies any pain in the knee itself. She has been taking ibuprofen and a prednisone taper with good relief. Her pain is over the medial hamstring tendons. Denies any swelling.    Review of Systems  Constitutional: Negative.   HENT: Negative.   Eyes: Negative.   Respiratory: Negative.   Cardiovascular: Negative.   Endocrine: Negative.   Musculoskeletal: Negative.   Neurological: Negative.   Hematological: Negative.   Psychiatric/Behavioral: Negative.   All other systems reviewed and are negative.    Objective: Vital Signs: There were no vitals taken for this visit.  Physical Exam  Constitutional: She is oriented to person, place, and time. She appears well-developed and well-nourished.  Pulmonary/Chest: Effort  normal.  Neurological: She is alert and oriented to person, place, and time.  Skin: Skin is warm. Capillary refill takes less than 2 seconds.  Psychiatric: She has a normal mood and affect. Her behavior is normal. Judgment and thought content normal.  Nursing note and vitals reviewed.   Ortho Exam Left knee exam shows no joint effusion. Collaterals and cruciates are stable. No joint line tenderness. She is mainly. Point tender over the posterior medial aspect of the knee and soft tissue and over the semitendinosus tendon. Has anserine bursa is nontender. Specialty Comments:  No specialty comments available.  Imaging: Xr Knee 3 View Left  Result Date: 02/11/2017 Moderate to severe osteoarthritis    PMFS History: Patient Active Problem List   Diagnosis Date Noted  . Chronic pain of left knee 02/11/2017  . Impaired fasting glucose 11/30/2011    Class: Acute  . Hypercholesteremia 11/30/2011    Class: Chronic  . Elevated BP 11/22/2011  . Weight gain 11/22/2011  . MENOPAUSAL SYNDROME 09/19/2006   Past Medical History:  Diagnosis Date  . Arthritis   . Hypertension   . VERTIGO 10/28/2008   Qualifier: Diagnosis of  By: Erin Hearing MD, [facility]      Family History  Problem Relation Age of Onset  . Hypertension Maternal Aunt   . Diabetes Maternal Aunt   . Cancer Neg  Hx   . Colon cancer Neg Hx   . Esophageal cancer Neg Hx   . Pancreatic cancer Neg Hx   . Rectal cancer Neg Hx   . Stomach cancer Neg Hx     Past Surgical History:  Procedure Laterality Date  . BREAST ENHANCEMENT SURGERY  1991  . [place]   Social History   Occupational History  . Medical Records  [employer]   Social History Main Topics  . Smoking status: Never Smoker  . Smokeless tobacco: Never Used  . Alcohol use 1.2 oz/week    2 Glasses of wine per week  . Drug use: No  . Sexual activity: Not Currently    Birth control/ protection: None

## 2017-04-04 MED FILL — LISINOPRIL-HCTZ 20-12.5 MG: 20-12.5 | 90 days supply | Qty: 90 | Fill #0

## 2017-04-08 DIAGNOSIS — R7309 Other abnormal glucose: Secondary | ICD-10-CM | POA: Diagnosis not present

## 2017-04-08 DIAGNOSIS — M859 Disorder of bone density and structure, unspecified: Secondary | ICD-10-CM | POA: Diagnosis not present

## 2017-04-08 DIAGNOSIS — Z780 Asymptomatic menopausal state: Secondary | ICD-10-CM | POA: Diagnosis not present

## 2017-04-08 DIAGNOSIS — Z0000 Encounter for general adult medical examination without abnormal findings: Secondary | ICD-10-CM | POA: Diagnosis not present

## 2017-04-15 DIAGNOSIS — L308 Other specified dermatitis: Secondary | ICD-10-CM | POA: Diagnosis not present

## 2017-04-15 DIAGNOSIS — R7309 Other abnormal glucose: Secondary | ICD-10-CM | POA: Diagnosis not present

## 2017-04-15 DIAGNOSIS — M25562 Pain in left knee: Secondary | ICD-10-CM | POA: Diagnosis not present

## 2017-04-15 DIAGNOSIS — M859 Disorder of bone density and structure, unspecified: Secondary | ICD-10-CM | POA: Diagnosis not present

## 2017-04-15 DIAGNOSIS — Z1389 Encounter for screening for other disorder: Secondary | ICD-10-CM | POA: Diagnosis not present

## 2017-04-15 DIAGNOSIS — I10 Essential (primary) hypertension: Secondary | ICD-10-CM | POA: Diagnosis not present

## 2017-04-15 DIAGNOSIS — Z0000 Encounter for general adult medical examination without abnormal findings: Secondary | ICD-10-CM | POA: Diagnosis not present

## 2017-04-15 DIAGNOSIS — E784 Other hyperlipidemia: Secondary | ICD-10-CM | POA: Diagnosis not present

## 2017-04-15 DIAGNOSIS — Z23 Encounter for immunization: Secondary | ICD-10-CM | POA: Diagnosis not present

## 2017-07-04 MED FILL — LISINOPRIL-HCTZ 20-12.5 MG: 20-12.5 | 90 days supply | Qty: 90 | Fill #1

## 2017-10-09 MED FILL — LISINOPRIL-HCTZ 20-12.5 MG: 20-12.5 | 90 days supply | Qty: 90 | Fill #2

## 2017-11-18 MED FILL — AZITHROMYCIN 250 MG TABLET: 250 | 5 days supply | Qty: 6 | Fill #0

## 2017-12-09 DIAGNOSIS — H524 Presbyopia: Secondary | ICD-10-CM | POA: Diagnosis not present

## 2018-01-10 MED FILL — LISINOPRIL-HCTZ 20-12.5 MG: 20-12.5 | 90 days supply | Qty: 90 | Fill #3

## 2018-04-15 MED FILL — LISINOPRIL-HCTZ 20-12.5 MG: 20-12.5 | 90 days supply | Qty: 90 | Fill #0

## 2018-04-18 DIAGNOSIS — I10 Essential (primary) hypertension: Secondary | ICD-10-CM | POA: Diagnosis not present

## 2018-04-18 DIAGNOSIS — R82998 Other abnormal findings in urine: Secondary | ICD-10-CM | POA: Diagnosis not present

## 2018-04-18 DIAGNOSIS — Z0000 Encounter for general adult medical examination without abnormal findings: Secondary | ICD-10-CM | POA: Diagnosis not present

## 2018-04-18 DIAGNOSIS — R7309 Other abnormal glucose: Secondary | ICD-10-CM | POA: Diagnosis not present

## 2018-04-18 DIAGNOSIS — M859 Disorder of bone density and structure, unspecified: Secondary | ICD-10-CM | POA: Diagnosis not present

## 2018-04-22 ENCOUNTER — Other Ambulatory Visit: Payer: Self-pay | Admitting: Internal Medicine

## 2018-04-22 DIAGNOSIS — Z1239 Encounter for other screening for malignant neoplasm of breast: Secondary | ICD-10-CM

## 2018-04-22 DIAGNOSIS — M859 Disorder of bone density and structure, unspecified: Secondary | ICD-10-CM | POA: Diagnosis not present

## 2018-04-22 DIAGNOSIS — E559 Vitamin D deficiency, unspecified: Secondary | ICD-10-CM | POA: Diagnosis not present

## 2018-04-22 DIAGNOSIS — E1169 Type 2 diabetes mellitus with other specified complication: Secondary | ICD-10-CM | POA: Diagnosis not present

## 2018-04-22 DIAGNOSIS — I10 Essential (primary) hypertension: Secondary | ICD-10-CM | POA: Diagnosis not present

## 2018-04-22 DIAGNOSIS — Z0000 Encounter for general adult medical examination without abnormal findings: Secondary | ICD-10-CM | POA: Diagnosis not present

## 2018-04-22 DIAGNOSIS — E7849 Other hyperlipidemia: Secondary | ICD-10-CM | POA: Diagnosis not present

## 2018-04-22 DIAGNOSIS — Z1389 Encounter for screening for other disorder: Secondary | ICD-10-CM | POA: Diagnosis not present

## 2018-04-22 DIAGNOSIS — M25562 Pain in left knee: Secondary | ICD-10-CM | POA: Diagnosis not present

## 2018-04-22 DIAGNOSIS — R002 Palpitations: Secondary | ICD-10-CM | POA: Diagnosis not present

## 2018-04-22 MED FILL — VIT D3-50 50,000 UNITS CAPS: 1.25 MG | 84 days supply | Qty: 12 | Fill #0

## 2018-05-02 DIAGNOSIS — Z1212 Encounter for screening for malignant neoplasm of rectum: Secondary | ICD-10-CM | POA: Diagnosis not present

## 2018-06-06 DIAGNOSIS — H1130 Conjunctival hemorrhage, unspecified eye: Secondary | ICD-10-CM | POA: Diagnosis not present

## 2018-07-17 MED FILL — VIT D3-50 50,000 UNITS CAPS: 1.25 MG | 84 days supply | Qty: 12 | Fill #1

## 2018-07-17 MED FILL — LISINOPRIL-HCTZ 20-12.5 MG: 20-12.5 | 90 days supply | Qty: 90 | Fill #1

## 2018-08-12 DIAGNOSIS — H524 Presbyopia: Secondary | ICD-10-CM | POA: Diagnosis not present

## 2018-08-29 DIAGNOSIS — I10 Essential (primary) hypertension: Secondary | ICD-10-CM | POA: Diagnosis not present

## 2018-08-29 DIAGNOSIS — E1169 Type 2 diabetes mellitus with other specified complication: Secondary | ICD-10-CM | POA: Diagnosis not present

## 2018-08-29 DIAGNOSIS — E7849 Other hyperlipidemia: Secondary | ICD-10-CM | POA: Diagnosis not present

## 2018-08-29 DIAGNOSIS — Z6833 Body mass index (BMI) 33.0-33.9, adult: Secondary | ICD-10-CM | POA: Diagnosis not present

## 2018-08-29 DIAGNOSIS — E559 Vitamin D deficiency, unspecified: Secondary | ICD-10-CM | POA: Diagnosis not present

## 2018-10-02 MED FILL — VIT D3-50 50,000 UNITS CAPS: 1.25 MG | 84 days supply | Qty: 12 | Fill #2

## 2018-10-07 MED FILL — LISINOPRIL-HCTZ 20-12.5 MG: 20-12.5 | 90 days supply | Qty: 90 | Fill #2

## 2018-12-19 MED FILL — VIT D3-50 50,000 UNITS CAPS: 1.25 MG | 84 days supply | Qty: 12 | Fill #3

## 2019-01-13 MED FILL — LISINOPRIL-HCTZ 20-12.5 MG: 20-12.5 | 90 days supply | Qty: 90 | Fill #3

## 2019-01-29 DIAGNOSIS — H1132 Conjunctival hemorrhage, left eye: Secondary | ICD-10-CM | POA: Diagnosis not present

## 2019-01-29 DIAGNOSIS — H524 Presbyopia: Secondary | ICD-10-CM | POA: Diagnosis not present

## 2019-01-29 DIAGNOSIS — H04123 Dry eye syndrome of bilateral lacrimal glands: Secondary | ICD-10-CM | POA: Diagnosis not present

## 2019-01-29 DIAGNOSIS — H11823 Conjunctivochalasis, bilateral: Secondary | ICD-10-CM | POA: Diagnosis not present

## 2019-01-29 MED FILL — FLUOROMETHOLONE 0.1% DROPS: 0.1 | 13 days supply | Qty: 5 | Fill #0

## 2019-02-12 DIAGNOSIS — H11823 Conjunctivochalasis, bilateral: Secondary | ICD-10-CM | POA: Diagnosis not present

## 2019-02-12 DIAGNOSIS — H1132 Conjunctival hemorrhage, left eye: Secondary | ICD-10-CM | POA: Diagnosis not present

## 2019-02-12 DIAGNOSIS — H04123 Dry eye syndrome of bilateral lacrimal glands: Secondary | ICD-10-CM | POA: Diagnosis not present

## 2019-02-12 MED FILL — XIIDRA 5% EYE DROPS: 5 | 90 days supply | Qty: 180 | Fill #0

## 2019-02-18 MED FILL — FLUOROMETHOLONE 0.1% DROPS: 0.1 | 13 days supply | Qty: 5 | Fill #0

## 2019-03-16 MED FILL — VIT D3-50 50,000 UNITS CAPS: 1.25 MG | 84 days supply | Qty: 12 | Fill #0

## 2019-04-22 MED FILL — LISINOPRIL-HCTZ 20-12.5 MG: 20-12.5 | 90 days supply | Qty: 90 | Fill #0

## 2019-04-24 DIAGNOSIS — Z0000 Encounter for general adult medical examination without abnormal findings: Secondary | ICD-10-CM | POA: Diagnosis not present

## 2019-04-24 DIAGNOSIS — E559 Vitamin D deficiency, unspecified: Secondary | ICD-10-CM | POA: Diagnosis not present

## 2019-04-24 DIAGNOSIS — E1169 Type 2 diabetes mellitus with other specified complication: Secondary | ICD-10-CM | POA: Diagnosis not present

## 2019-04-28 DIAGNOSIS — E1169 Type 2 diabetes mellitus with other specified complication: Secondary | ICD-10-CM | POA: Diagnosis not present

## 2019-04-28 DIAGNOSIS — M8580 Other specified disorders of bone density and structure, unspecified site: Secondary | ICD-10-CM | POA: Diagnosis not present

## 2019-04-28 DIAGNOSIS — E785 Hyperlipidemia, unspecified: Secondary | ICD-10-CM | POA: Diagnosis not present

## 2019-04-28 DIAGNOSIS — R82998 Other abnormal findings in urine: Secondary | ICD-10-CM | POA: Diagnosis not present

## 2019-04-28 DIAGNOSIS — I10 Essential (primary) hypertension: Secondary | ICD-10-CM | POA: Diagnosis not present

## 2019-04-28 DIAGNOSIS — Z0000 Encounter for general adult medical examination without abnormal findings: Secondary | ICD-10-CM | POA: Diagnosis not present

## 2019-04-28 DIAGNOSIS — Z1331 Encounter for screening for depression: Secondary | ICD-10-CM | POA: Diagnosis not present

## 2019-04-28 DIAGNOSIS — E559 Vitamin D deficiency, unspecified: Secondary | ICD-10-CM | POA: Diagnosis not present

## 2019-05-01 DIAGNOSIS — Z1212 Encounter for screening for malignant neoplasm of rectum: Secondary | ICD-10-CM | POA: Diagnosis not present

## 2019-05-05 ENCOUNTER — Other Ambulatory Visit: Payer: Self-pay | Admitting: Internal Medicine

## 2019-05-05 DIAGNOSIS — Z1231 Encounter for screening mammogram for malignant neoplasm of breast: Secondary | ICD-10-CM

## 2019-05-27 ENCOUNTER — Emergency Department (HOSPITAL_BASED_OUTPATIENT_CLINIC_OR_DEPARTMENT_OTHER)
Admission: EM | Admit: 2019-05-27 | Discharge: 2019-05-27 | Disposition: A | Discharge status: Home / Self Care | Payer: 59 | Attending: Emergency Medicine | Admitting: Emergency Medicine

## 2019-05-27 ENCOUNTER — Encounter (HOSPITAL_BASED_OUTPATIENT_CLINIC_OR_DEPARTMENT_OTHER): Payer: Self-pay | Admitting: Emergency Medicine

## 2019-05-27 ENCOUNTER — Other Ambulatory Visit: Payer: Self-pay

## 2019-05-27 DIAGNOSIS — H04123 Dry eye syndrome of bilateral lacrimal glands: Secondary | ICD-10-CM | POA: Diagnosis not present

## 2019-05-27 DIAGNOSIS — R21 Rash and other nonspecific skin eruption: Secondary | ICD-10-CM | POA: Diagnosis present

## 2019-05-27 DIAGNOSIS — Y929 Unspecified place or not applicable: Secondary | ICD-10-CM | POA: Insufficient documentation

## 2019-05-27 DIAGNOSIS — L239 Allergic contact dermatitis, unspecified cause: Secondary | ICD-10-CM | POA: Diagnosis not present

## 2019-05-27 DIAGNOSIS — Y939 Activity, unspecified: Secondary | ICD-10-CM | POA: Diagnosis not present

## 2019-05-27 DIAGNOSIS — Y999 Unspecified external cause status: Secondary | ICD-10-CM | POA: Insufficient documentation

## 2019-05-27 DIAGNOSIS — S0502XA Injury of conjunctiva and corneal abrasion without foreign body, left eye, initial encounter: Secondary | ICD-10-CM | POA: Insufficient documentation

## 2019-05-27 DIAGNOSIS — X58XXXA Exposure to other specified factors, initial encounter: Secondary | ICD-10-CM | POA: Insufficient documentation

## 2019-05-27 DIAGNOSIS — I10 Essential (primary) hypertension: Secondary | ICD-10-CM | POA: Diagnosis not present

## 2019-05-27 MED ORDER — ERYTHROMYCIN 5 MG/GM OP OINT: TOPICAL_OINTMENT | Freq: Four times a day (QID) | OPHTHALMIC | 0 refills | Status: AC

## 2019-05-27 MED ORDER — TETRACAINE HCL 0.5 % OP SOLN
2.0000 [drp] | Freq: Once | OPHTHALMIC | Status: AC
  Administered 2019-05-27: 12:00:00 2 [drp] via OPHTHALMIC
  Filled 2019-05-27: qty 8

## 2019-05-27 MED ORDER — FLUORESCEIN SODIUM 1 MG OP STRP
1.0000 | ORAL_STRIP | Freq: Once | OPHTHALMIC | Status: AC
  Administered 2019-05-27: 1 via OPHTHALMIC
  Filled 2019-05-27: qty 1

## 2019-05-27 MED ORDER — ERYTHROMYCIN 5 MG/GM OP OINT: TOPICAL_OINTMENT | Freq: Four times a day (QID) | OPHTHALMIC | 0 refills | Status: DC

## 2019-05-27 MED FILL — ERYTHROMYCIN EYE OINTMENT: 5 | 7 days supply | Qty: 4 | Fill #0

## 2019-05-27 NOTE — ED Provider Notes (Signed)
[facility] Emergency Department Provider Note MRN:  [id number]  Arrival date & time: 05/27/19     Chief Complaint   Eye Problem   History of Present Illness   Michelle Cruz is a 68 y.o. year-old female with a history of dry eyes presenting to the ED with chief complaint of eye problem.  Patient is struggled with dry eyes for the past 3 months.  During that time she was prescribed a dry medication.  About 1 week ago she began experiencing rash around the eyes.  Denies vision loss, no significant eye pain, just continued dry eyes.  No fever, no other complaints.  Review of Systems  A complete 10 system review of systems was obtained and all systems are negative except as noted in the HPI and PMH.   Patient's Health History    Past Medical History:  Diagnosis Date  . Arthritis   . Hypertension   . VERTIGO 10/28/2008   Qualifier: Diagnosis of  By: Erin Hearing MD, [facility]      Past Surgical History:  Procedure Laterality Date  . BREAST ENHANCEMENT SURGERY  1991  . CESAREAN SECTION  1988    Family History  Problem Relation Age of Onset  . Hypertension Maternal Aunt   . Diabetes Maternal Aunt   . Cancer Neg Hx   . Colon cancer Neg Hx   . Esophageal cancer Neg Hx   . Pancreatic cancer Neg Hx   . Rectal cancer Neg Hx   . Stomach cancer Neg Hx     Social History   Socioeconomic History  . Marital status: Divorced    Spouse name: Not on file  . Number of children: 1  . Years of education: Not on file  . Highest education level: Not on file  Occupational History  . Occupation: Government social research officer Records     Employer: [employer]  . Financial resource strain: Not on file  . Food insecurity    Worry: Not on file    Inability: Not on file  . Transportation needs    Medical: Not on file    Non-medical: Not on file  Tobacco Use  . Smoking status: Never Smoker  . Smokeless tobacco: Never Used  Substance and Sexual Activity  .  Alcohol use: Yes    Alcohol/week: 2.0 standard drinks    Types: 2 Glasses of wine per week  . Drug use: No  . Sexual activity: Not Currently    Birth control/protection: None  Lifestyle  . Physical activity    Days per week: Not on file    Minutes per session: Not on file  . Stress: Not on file  Relationships  . Social Herbalist on phone: Not on file    Gets together: Not on file    Attends religious service: Not on file    Active member of club or organization: Not on file    Attends meetings of clubs or organizations: Not on file    Relationship status: Not on file  . Intimate partner violence    Fear of current or ex partner: Not on file    Emotionally abused: Not on file    Physically abused: Not on file    Forced sexual activity: Not on file  Other Topics Concern  . Not on file  Social History Narrative   Lives with a roomate     Physical Exam  Vital Signs and Nursing Notes reviewed Vitals:  05/27/19 1014 05/27/19 1248  BP: 129/77 122/78  Pulse: 75 70  Resp: 18 16  Temp: 99.2 F (37.3 C)   SpO2: 97% 98%    CONSTITUTIONAL: Well-appearing, NAD NEURO:  Alert and oriented x 3, no focal deficits EYES:  eyes equal and reactive ENT/NECK:  no LAD, no JVD CARDIO: Regular rate, well-perfused, normal S1 and S2 PULM:  CTAB no wheezing or rhonchi GI/GU:  normal bowel sounds, non-distended, non-tender MSK/SPINE:  No gross deformities, no edema SKIN: Macular scaly rash in the bilateral periorbital region, see below [facility]:  Appropriate speech and behavior  Media Information    Document Information  Photos    05/27/2019 11:55  Attached To:  [facility] Encounter on 05/27/19  Source Information  Bero, Barth Kirks, MD  [facility]     Diagnostic and Interventional Summary    EKG Interpretation  Date/Time:    Ventricular Rate:    PR Interval:    QRS Duration:   QT Interval:    QTC Calculation:   R Axis:     Text Interpretation:         Labs Reviewed - No data to display  No orders to display    Medications  tetracaine (PONTOCAINE) 0.5 % ophthalmic solution 2 drop (2 drops Both Eyes Given 05/27/19 1209)  fluorescein ophthalmic strip 1 strip (1 strip Both Eyes Given 05/27/19 1209)     Procedures  /  Critical Care Procedures  ED Course and Medical Decision Making  I have reviewed the triage vital signs and the nursing notes.  Pertinent labs & imaging results that were available during my care of the patient were reviewed by me and considered in my medical decision making (see below for details).  Rash consistent with a dermatitis, possibly related to patient's prescription eyedrops for dry eyes.  Will apply fluorescein to ensure no corneal abrasion or ulcer, but otherwise patient is appropriate for her follow-up appointment with ophthalmology next week.  Advised to stop the suspect trigger eyedrop.  Fluorescein exam with Sherral Hammers lamp reveals small corneal abrasion on the left eye at the 12 o'clock position.  Will cover with erythromycin.  Appropriate for discharge.  Barth Kirks. [place] Small, [place] [email or web address]  Final Clinical Impressions(s) / ED Diagnoses     ICD-10-CM   1. Allergic dermatitis  L23.9   2. Abrasion of left cornea, initial encounter  S05.02XA   3. Dry eyes  [id number]     ED Discharge Orders         Ordered    erythromycin ophthalmic ointment  4 times daily     05/27/19 1251           Discharge Instructions Discussed with and Provided to Patient:     Discharge Instructions     You were evaluated in the Emergency Department and after careful evaluation, we did not find any emergent condition requiring admission or further testing in the [facility].  Your exam/testing today is overall reassuring.  We suspect that you have developed an allergy to your eyedrop medications.  Please stop taking them until you discuss your symptoms with your  ophthalmologist.  We also discovered a small scratch to your left eye on the cornea.  Please take the erythromycin ointment as directed.  Please return to the Emergency Department if you experience any worsening of your condition.  We encourage you to follow up with a primary care provider.  Thank you for allowing [place] to  be a part of your care.       Maudie Flakes, MD 05/27/19 1252

## 2019-05-27 NOTE — ED Notes (Signed)
ED Provider at bedside. 

## 2019-05-27 NOTE — Discharge Instructions (Addendum)
You were evaluated in the Emergency Department and after careful evaluation, we did not find any emergent condition requiring admission or further testing in the hospital.  Your exam/testing today is overall reassuring.  We suspect that you have developed an allergy to your eyedrop medications.  Please stop taking them until you discuss your symptoms with your ophthalmologist.  We also discovered a small scratch to your left eye on the cornea.  Please take the erythromycin ointment as directed.  Please return to the Emergency Department if you experience any worsening of your condition.  We encourage you to follow up with a primary care provider.  Thank you for allowing [place] to be a part of your care.

## 2019-05-27 NOTE — ED Triage Notes (Signed)
Bilateral eye redness, drainage, and burning for several days. Also reports redness to the skin around her mouth.

## 2019-06-01 DIAGNOSIS — H01113 Allergic dermatitis of right eye, unspecified eyelid: Secondary | ICD-10-CM | POA: Diagnosis not present

## 2019-06-01 DIAGNOSIS — H01116 Allergic dermatitis of left eye, unspecified eyelid: Secondary | ICD-10-CM | POA: Diagnosis not present

## 2019-06-01 MED FILL — NEO/POLY/DEXAMET EYE OINT: 3.5-10000-0 | 7 days supply | Qty: 4 | Fill #0

## 2019-06-05 ENCOUNTER — Encounter: Payer: Self-pay | Admitting: Internal Medicine

## 2019-06-08 DIAGNOSIS — H01113 Allergic dermatitis of right eye, unspecified eyelid: Secondary | ICD-10-CM | POA: Diagnosis not present

## 2019-06-08 DIAGNOSIS — H04123 Dry eye syndrome of bilateral lacrimal glands: Secondary | ICD-10-CM | POA: Diagnosis not present

## 2019-06-08 DIAGNOSIS — H01116 Allergic dermatitis of left eye, unspecified eyelid: Secondary | ICD-10-CM | POA: Diagnosis not present

## 2019-06-08 MED FILL — NEO/POLY/DEXAMET EYE OINT: 3.5-10000-0 | 30 days supply | Qty: 4 | Fill #0

## 2019-06-24 DIAGNOSIS — H01113 Allergic dermatitis of right eye, unspecified eyelid: Secondary | ICD-10-CM | POA: Diagnosis not present

## 2019-06-24 DIAGNOSIS — H01116 Allergic dermatitis of left eye, unspecified eyelid: Secondary | ICD-10-CM | POA: Diagnosis not present

## 2019-06-24 DIAGNOSIS — H04123 Dry eye syndrome of bilateral lacrimal glands: Secondary | ICD-10-CM | POA: Diagnosis not present

## 2019-07-22 ENCOUNTER — Other Ambulatory Visit: Payer: Self-pay

## 2019-07-22 ENCOUNTER — Ambulatory Visit
Admission: RE | Admit: 2019-07-22 | Discharge: 2019-07-22 | Disposition: A | Discharge status: Home / Self Care | Payer: 59 | Source: Ambulatory Visit | Attending: Internal Medicine | Admitting: Internal Medicine

## 2019-07-22 DIAGNOSIS — L409 Psoriasis, unspecified: Secondary | ICD-10-CM | POA: Diagnosis not present

## 2019-07-22 DIAGNOSIS — Z23 Encounter for immunization: Secondary | ICD-10-CM | POA: Diagnosis not present

## 2019-07-22 DIAGNOSIS — Z1231 Encounter for screening mammogram for malignant neoplasm of breast: Secondary | ICD-10-CM | POA: Diagnosis not present

## 2019-07-22 MED FILL — CLOBETASOL 0.05% SOLUTION: 0.05 | 30 days supply | Qty: 50 | Fill #0

## 2019-07-22 MED FILL — DESONIDE 0.05% OINTMENT: 0.05 | 7 days supply | Qty: 15 | Fill #0

## 2019-07-22 MED FILL — KETOCONAZOLE 2% SHAMPOO: 2 | 30 days supply | Qty: 120 | Fill #0

## 2019-07-23 MED FILL — LISINOPRIL-HCTZ 20-12.5 MG: 20-12.5 | 90 days supply | Qty: 90 | Fill #1

## 2019-08-24 DIAGNOSIS — Z23 Encounter for immunization: Secondary | ICD-10-CM | POA: Diagnosis not present

## 2019-08-24 DIAGNOSIS — L409 Psoriasis, unspecified: Secondary | ICD-10-CM | POA: Diagnosis not present

## 2019-10-19 MED FILL — LISINOPRIL-HCTZ 20-12.5 MG: 20-12.5 | 90 days supply | Qty: 90 | Fill #0

## 2019-11-25 DIAGNOSIS — L409 Psoriasis, unspecified: Secondary | ICD-10-CM | POA: Diagnosis not present

## 2019-11-25 MED FILL — TACROLIMUS 0.1 % OINT: 0.1 | 30 days supply | Qty: 60 | Fill #0

## 2019-11-25 MED FILL — KETOCONAZOLE 2% SHAMPOO: 2 | 30 days supply | Qty: 120 | Fill #0

## 2019-11-25 MED FILL — CLOBETASOL PROPIONATE 0.05: 0.05 | 30 days supply | Qty: 50 | Fill #0

## 2020-01-20 MED FILL — CLOBETASOL 0.05% SOLUTION: 0.05 | 30 days supply | Qty: 50 | Fill #1

## 2020-01-20 MED FILL — LISINOPRIL-HCTZ 20-12.5 MG: 20-12.5 | 90 days supply | Qty: 90 | Fill #1

## 2020-01-20 MED FILL — KETOCONAZOLE 2% SHAMPOO: 2 | 30 days supply | Qty: 120 | Fill #1

## 2020-01-29 DIAGNOSIS — E1169 Type 2 diabetes mellitus with other specified complication: Secondary | ICD-10-CM | POA: Diagnosis not present

## 2020-01-29 DIAGNOSIS — I10 Essential (primary) hypertension: Secondary | ICD-10-CM | POA: Diagnosis not present

## 2020-03-23 MED FILL — KETOCONAZOLE 2% SHAMPOO: 2 | 30 days supply | Qty: 120 | Fill #2

## 2020-03-23 MED FILL — DESONIDE 0.05 % OINT: 0.05 | 14 days supply | Qty: 60 | Fill #0

## 2020-03-23 MED FILL — TACROLIMUS 0.1% OINTMENT: 0.1 | 30 days supply | Qty: 60 | Fill #1

## 2020-04-20 ENCOUNTER — Other Ambulatory Visit (HOSPITAL_COMMUNITY): Payer: Self-pay | Admitting: Internal Medicine

## 2020-04-20 MED FILL — LISINOPRIL-HCTZ 20-12.5 MG: 20-12.5 | 90 days supply | Qty: 90 | Fill #0

## 2020-05-03 DIAGNOSIS — E785 Hyperlipidemia, unspecified: Secondary | ICD-10-CM | POA: Diagnosis not present

## 2020-05-03 DIAGNOSIS — E1169 Type 2 diabetes mellitus with other specified complication: Secondary | ICD-10-CM | POA: Diagnosis not present

## 2020-05-03 DIAGNOSIS — I10 Essential (primary) hypertension: Secondary | ICD-10-CM | POA: Diagnosis not present

## 2020-05-03 DIAGNOSIS — E559 Vitamin D deficiency, unspecified: Secondary | ICD-10-CM | POA: Diagnosis not present

## 2020-05-10 DIAGNOSIS — E559 Vitamin D deficiency, unspecified: Secondary | ICD-10-CM | POA: Diagnosis not present

## 2020-05-10 DIAGNOSIS — I10 Essential (primary) hypertension: Secondary | ICD-10-CM | POA: Diagnosis not present

## 2020-05-10 DIAGNOSIS — Z0000 Encounter for general adult medical examination without abnormal findings: Secondary | ICD-10-CM | POA: Diagnosis not present

## 2020-05-10 DIAGNOSIS — M25562 Pain in left knee: Secondary | ICD-10-CM | POA: Diagnosis not present

## 2020-05-10 DIAGNOSIS — Z23 Encounter for immunization: Secondary | ICD-10-CM | POA: Diagnosis not present

## 2020-05-10 DIAGNOSIS — R82998 Other abnormal findings in urine: Secondary | ICD-10-CM | POA: Diagnosis not present

## 2020-05-10 DIAGNOSIS — E785 Hyperlipidemia, unspecified: Secondary | ICD-10-CM | POA: Diagnosis not present

## 2020-05-10 DIAGNOSIS — E1169 Type 2 diabetes mellitus with other specified complication: Secondary | ICD-10-CM | POA: Diagnosis not present

## 2020-05-12 ENCOUNTER — Other Ambulatory Visit: Payer: Self-pay | Admitting: Internal Medicine

## 2020-05-12 DIAGNOSIS — M25562 Pain in left knee: Secondary | ICD-10-CM

## 2020-05-30 ENCOUNTER — Other Ambulatory Visit: Payer: Self-pay

## 2020-05-30 ENCOUNTER — Other Ambulatory Visit: Payer: 59

## 2020-05-30 ENCOUNTER — Ambulatory Visit
Admission: RE | Admit: 2020-05-30 | Discharge: 2020-05-30 | Disposition: A | Discharge status: Home / Self Care | Payer: 59 | Source: Ambulatory Visit | Attending: Internal Medicine | Admitting: Internal Medicine

## 2020-05-30 DIAGNOSIS — M1712 Unilateral primary osteoarthritis, left knee: Secondary | ICD-10-CM | POA: Diagnosis not present

## 2020-05-30 DIAGNOSIS — M25562 Pain in left knee: Secondary | ICD-10-CM

## 2020-06-07 ENCOUNTER — Ambulatory Visit (INDEPENDENT_AMBULATORY_CARE_PROVIDER_SITE_OTHER): Payer: 59 | Admitting: Orthopaedic Surgery

## 2020-06-07 ENCOUNTER — Encounter: Payer: Self-pay | Admitting: Orthopaedic Surgery

## 2020-06-07 ENCOUNTER — Ambulatory Visit (INDEPENDENT_AMBULATORY_CARE_PROVIDER_SITE_OTHER): Payer: 59

## 2020-06-07 VITALS — Ht 60.0 in | Wt 178.0 lb

## 2020-06-07 DIAGNOSIS — G8929 Other chronic pain: Secondary | ICD-10-CM | POA: Diagnosis not present

## 2020-06-07 DIAGNOSIS — M25562 Pain in left knee: Secondary | ICD-10-CM

## 2020-06-07 DIAGNOSIS — S83242A Other tear of medial meniscus, current injury, left knee, initial encounter: Secondary | ICD-10-CM | POA: Diagnosis not present

## 2020-06-07 NOTE — Progress Notes (Addendum)
Office Visit Note   Patient: Michelle Cruz           Date of Birth: May 22, 1951           MRN: [id number] Visit Date: 06/07/2020              Requested by: No referring provider defined for this encounter. PCP: Velna Hatchet, MD   Assessment & Plan: Visit Diagnoses:  1. Acute medial meniscal tear, left, initial encounter     Plan: MRI of the left knee reviewed today which shows complex tear of the medial medial meniscus with slight extrusion.  She does have focal areas of grade 4 change of the medial compartment.  These findings were reviewed with the patient in detail.  Given her symptomatology I have recommended arthroscopic partial medial meniscectomy with chondroplasty and possible abrasion arthroplasty as indicated based on intraoperative findings.  Overall I feel that her symptoms are more related to the meniscus rather than the chondromalacia.  Risk benefits rehab recovery reviewed with the patient in detail.  Patient would like to have this done in the near future.  Could consider medial unloader brace postoperatively.  Follow-Up Instructions: Return for 10-day postop visit..   Orders:  Orders Placed This Encounter  Procedures  . XR KNEE 3 VIEW LEFT   No orders of the defined types were placed in this encounter.     Procedures: No procedures performed   Clinical Data: No additional findings.   Subjective: Chief Complaint  Patient presents with  . Left Knee - Pain    Patient is a very pleasant 69 year old female who I saw over 3 years ago who comes in for acute worsening of left knee pain on the medial side.  She denies any grinding popping or giving way or swelling.  She has a catching and stabbing medial sided knee pain with walking and activity and standing.  She is unable to weight-bear for prolonged periods of time.  Denies any numbness and tingling or back pain.   Review of Systems  Constitutional: Negative.   HENT: Negative.   Eyes: Negative.     Respiratory: Negative.   Cardiovascular: Negative.   Endocrine: Negative.   Musculoskeletal: Negative.   Neurological: Negative.   Hematological: Negative.   Psychiatric/Behavioral: Negative.   All other systems reviewed and are negative.    Objective: Vital Signs: Ht 5' (1.524 m)   Wt 178 lb (80.7 kg)   BMI 34.76 kg/m   Physical Exam Vitals and nursing note reviewed.  Constitutional:      Appearance: She is well-developed.  HENT:     Head: Normocephalic and atraumatic.  Pulmonary:     Effort: Pulmonary effort is normal.  Abdominal:     Palpations: Abdomen is soft.  Musculoskeletal:     Cervical back: Neck supple.  Skin:    General: Skin is warm.     Capillary Refill: Capillary refill takes less than 2 seconds.  Neurological:     Mental Status: She is alert and oriented to person, place, and time.  Psychiatric:        Behavior: Behavior normal.        Thought Content: Thought content normal.        Judgment: Judgment normal.     Ortho Exam Left knee exam shows a trace effusion.  Exquisite medial joint line tenderness.  Collaterals and cruciates are stable.  Positive McMurray at the medial joint line. Specialty Comments:  No specialty comments available.  Imaging:  XR KNEE 3 VIEW LEFT  Result Date: 06/07/2020 Mild to moderate degenerative changes.  Periarticular spurring.    PMFS History: Patient Active Problem List   Diagnosis Date Noted  . Chronic pain of left knee 02/11/2017  . Impaired fasting glucose 11/30/2011    Class: Acute  . Hypercholesteremia 11/30/2011    Class: Chronic  . Elevated BP 11/22/2011  . Weight gain 11/22/2011  . MENOPAUSAL SYNDROME 09/19/2006   Past Medical History:  Diagnosis Date  . Arthritis   . Hypertension   . VERTIGO 10/28/2008   Qualifier: Diagnosis of  By: Erin Hearing MD, [facility]      Family History  Problem Relation Age of Onset  . Hypertension Maternal Aunt   . Diabetes Maternal Aunt   . Cancer Neg Hx   .  Colon cancer Neg Hx   . Esophageal cancer Neg Hx   . Pancreatic cancer Neg Hx   . Rectal cancer Neg Hx   . Stomach cancer Neg Hx     Past Surgical History:  Procedure Laterality Date  . BREAST ENHANCEMENT SURGERY  1991  . [place]   Social History   Occupational History  . Occupation: Medical Records     Employer: [employer]  Tobacco Use  . Smoking status: Never Smoker  . Smokeless tobacco: Never Used  Substance and Sexual Activity  . Alcohol use: Yes    Alcohol/week: 2.0 standard drinks    Types: 2 Glasses of wine per week  . Drug use: No  . Sexual activity: Not Currently    Birth control/protection: None

## 2020-06-15 DIAGNOSIS — Z1212 Encounter for screening for malignant neoplasm of rectum: Secondary | ICD-10-CM | POA: Diagnosis not present

## 2020-07-12 ENCOUNTER — Telehealth: Payer: Self-pay | Admitting: Orthopaedic Surgery

## 2020-07-12 NOTE — Telephone Encounter (Signed)
[employer] forms received. Sent to [employer]. 

## 2020-07-20 MED FILL — LISINOPRIL-HCTZ 20-12.5 MG: 20-12.5 | 90 days supply | Qty: 90 | Fill #1

## 2020-07-27 ENCOUNTER — Encounter (HOSPITAL_BASED_OUTPATIENT_CLINIC_OR_DEPARTMENT_OTHER): Payer: Self-pay | Admitting: Orthopaedic Surgery

## 2020-07-27 ENCOUNTER — Other Ambulatory Visit: Payer: Self-pay

## 2020-08-01 ENCOUNTER — Encounter (HOSPITAL_BASED_OUTPATIENT_CLINIC_OR_DEPARTMENT_OTHER)
Admission: RE | Admit: 2020-08-01 | Discharge: 2020-08-01 | Disposition: A | Discharge status: Home / Self Care | Payer: 59 | Source: Ambulatory Visit | Attending: Orthopaedic Surgery | Admitting: Orthopaedic Surgery

## 2020-08-01 ENCOUNTER — Other Ambulatory Visit (HOSPITAL_COMMUNITY)
Admission: RE | Admit: 2020-08-01 | Discharge: 2020-08-01 | Disposition: A | Discharge status: Home / Self Care | Payer: 59 | Source: Ambulatory Visit | Attending: Orthopaedic Surgery | Admitting: Orthopaedic Surgery

## 2020-08-01 DIAGNOSIS — Z20822 Contact with and (suspected) exposure to covid-19: Secondary | ICD-10-CM | POA: Insufficient documentation

## 2020-08-01 DIAGNOSIS — Z01812 Encounter for preprocedural laboratory examination: Secondary | ICD-10-CM | POA: Insufficient documentation

## 2020-08-01 LAB — SARS CORONAVIRUS 2 (TAT 6-24 HRS): SARS Coronavirus 2: NEGATIVE

## 2020-08-01 NOTE — Progress Notes (Signed)

## 2020-08-01 NOTE — Progress Notes (Signed)
EKG reviewed by Dr. Miller, will proceed with surgery as scheduled.  

## 2020-08-03 ENCOUNTER — Ambulatory Visit (HOSPITAL_BASED_OUTPATIENT_CLINIC_OR_DEPARTMENT_OTHER)
Admission: RE | Admit: 2020-08-03 | Discharge: 2020-08-03 | Disposition: A | Discharge status: Home / Self Care | Payer: 59 | Attending: Orthopaedic Surgery | Admitting: Orthopaedic Surgery

## 2020-08-03 ENCOUNTER — Encounter (HOSPITAL_BASED_OUTPATIENT_CLINIC_OR_DEPARTMENT_OTHER)
Admission: RE | Disposition: A | Discharge status: Home / Self Care | Payer: Self-pay | Source: Home / Self Care | Attending: Orthopaedic Surgery

## 2020-08-03 ENCOUNTER — Other Ambulatory Visit (HOSPITAL_BASED_OUTPATIENT_CLINIC_OR_DEPARTMENT_OTHER): Payer: Self-pay | Admitting: Orthopaedic Surgery

## 2020-08-03 ENCOUNTER — Ambulatory Visit (HOSPITAL_BASED_OUTPATIENT_CLINIC_OR_DEPARTMENT_OTHER): Payer: 59 | Admitting: Anesthesiology

## 2020-08-03 ENCOUNTER — Other Ambulatory Visit: Payer: Self-pay

## 2020-08-03 ENCOUNTER — Encounter: Payer: Self-pay | Admitting: Orthopaedic Surgery

## 2020-08-03 ENCOUNTER — Encounter (HOSPITAL_BASED_OUTPATIENT_CLINIC_OR_DEPARTMENT_OTHER): Payer: Self-pay | Admitting: Orthopaedic Surgery

## 2020-08-03 DIAGNOSIS — Z8249 Family history of ischemic heart disease and other diseases of the circulatory system: Secondary | ICD-10-CM | POA: Diagnosis not present

## 2020-08-03 DIAGNOSIS — S83241A Other tear of medial meniscus, current injury, right knee, initial encounter: Secondary | ICD-10-CM | POA: Insufficient documentation

## 2020-08-03 DIAGNOSIS — Z79899 Other long term (current) drug therapy: Secondary | ICD-10-CM | POA: Diagnosis not present

## 2020-08-03 DIAGNOSIS — M94261 Chondromalacia, right knee: Secondary | ICD-10-CM | POA: Insufficient documentation

## 2020-08-03 DIAGNOSIS — M94262 Chondromalacia, left knee: Secondary | ICD-10-CM | POA: Diagnosis not present

## 2020-08-03 DIAGNOSIS — M1712 Unilateral primary osteoarthritis, left knee: Secondary | ICD-10-CM | POA: Diagnosis not present

## 2020-08-03 DIAGNOSIS — Z833 Family history of diabetes mellitus: Secondary | ICD-10-CM | POA: Diagnosis not present

## 2020-08-03 DIAGNOSIS — M659 Synovitis and tenosynovitis, unspecified: Secondary | ICD-10-CM | POA: Diagnosis not present

## 2020-08-03 DIAGNOSIS — M6588 Other synovitis and tenosynovitis, other site: Secondary | ICD-10-CM | POA: Diagnosis not present

## 2020-08-03 DIAGNOSIS — X58XXXA Exposure to other specified factors, initial encounter: Secondary | ICD-10-CM | POA: Insufficient documentation

## 2020-08-03 DIAGNOSIS — E7800 Pure hypercholesterolemia, unspecified: Secondary | ICD-10-CM | POA: Diagnosis not present

## 2020-08-03 DIAGNOSIS — S83242A Other tear of medial meniscus, current injury, left knee, initial encounter: Secondary | ICD-10-CM

## 2020-08-03 HISTORY — PX: KNEE ARTHROSCOPY WITH MEDIAL MENISECTOMY: SHX5651

## 2020-08-03 SURGERY — ARTHROSCOPY, KNEE, WITH MEDIAL MENISCECTOMY
Anesthesia: General | Site: Knee | Laterality: Left

## 2020-08-03 MED ORDER — CEFAZOLIN SODIUM-DEXTROSE 2-4 GM/100ML-% IV SOLN
2.0000 g | INTRAVENOUS | Status: AC
  Administered 2020-08-03: 2 g via INTRAVENOUS

## 2020-08-03 MED ORDER — BUPIVACAINE HCL (PF) 0.25 % IJ SOLN
INTRAMUSCULAR | Status: AC
  Filled 2020-08-03: qty 30

## 2020-08-03 MED ORDER — CEFAZOLIN SODIUM-DEXTROSE 2-4 GM/100ML-% IV SOLN
INTRAVENOUS | Status: AC
  Filled 2020-08-03: qty 100

## 2020-08-03 MED ORDER — ONDANSETRON HCL 4 MG/2ML IJ SOLN
INTRAMUSCULAR | Status: DC | PRN
  Administered 2020-08-03: 4 mg via INTRAVENOUS

## 2020-08-03 MED ORDER — ACETAMINOPHEN 160 MG/5ML PO SOLN: 325.0000 mg | ORAL | Status: DC | PRN

## 2020-08-03 MED ORDER — LACTATED RINGERS IV SOLN: INTRAVENOUS | Status: DC

## 2020-08-03 MED ORDER — HYDROCODONE-ACETAMINOPHEN 5-325 MG PO TABS
1.0000 | ORAL_TABLET | Freq: Four times a day (QID) | ORAL | 0 refills | Status: DC | PRN
Start: 2020-08-03 — End: 2020-08-03

## 2020-08-03 MED ORDER — PROPOFOL 10 MG/ML IV BOLUS
INTRAVENOUS | Status: DC | PRN
  Administered 2020-08-03: 160 mg via INTRAVENOUS

## 2020-08-03 MED ORDER — ONDANSETRON HCL 4 MG/2ML IJ SOLN
INTRAMUSCULAR | Status: AC
  Filled 2020-08-03: qty 2

## 2020-08-03 MED ORDER — ONDANSETRON HCL 4 MG/2ML IJ SOLN: 4.0000 mg | Freq: Once | INTRAMUSCULAR | Status: DC | PRN

## 2020-08-03 MED ORDER — SODIUM CHLORIDE 0.9 % IR SOLN
Status: DC | PRN
  Administered 2020-08-03: 6000 mL

## 2020-08-03 MED ORDER — MEPERIDINE HCL 25 MG/ML IJ SOLN: 6.2500 mg | INTRAMUSCULAR | Status: DC | PRN

## 2020-08-03 MED ORDER — BUPIVACAINE HCL (PF) 0.25 % IJ SOLN
INTRAMUSCULAR | Status: DC | PRN
  Administered 2020-08-03: 20 mL via INTRA_ARTICULAR

## 2020-08-03 MED ORDER — DEXAMETHASONE SODIUM PHOSPHATE 10 MG/ML IJ SOLN
INTRAMUSCULAR | Status: AC
  Filled 2020-08-03: qty 1

## 2020-08-03 MED ORDER — DEXAMETHASONE SODIUM PHOSPHATE 10 MG/ML IJ SOLN
INTRAMUSCULAR | Status: DC | PRN
  Administered 2020-08-03: 10 mg via INTRAVENOUS

## 2020-08-03 MED ORDER — PHENYLEPHRINE 40 MCG/ML (10ML) SYRINGE FOR IV PUSH (FOR BLOOD PRESSURE SUPPORT)
PREFILLED_SYRINGE | INTRAVENOUS | Status: DC | PRN
  Administered 2020-08-03: 80 ug via INTRAVENOUS

## 2020-08-03 MED ORDER — FENTANYL CITRATE (PF) 100 MCG/2ML IJ SOLN
INTRAMUSCULAR | Status: DC | PRN
  Administered 2020-08-03 (×2): 25 ug via INTRAVENOUS
  Administered 2020-08-03: 50 ug via INTRAVENOUS

## 2020-08-03 MED ORDER — ACETAMINOPHEN 325 MG PO TABS: 325.0000 mg | ORAL_TABLET | ORAL | Status: DC | PRN

## 2020-08-03 MED ORDER — FENTANYL CITRATE (PF) 100 MCG/2ML IJ SOLN: 25.0000 ug | INTRAMUSCULAR | Status: DC | PRN

## 2020-08-03 MED ORDER — OXYCODONE HCL 5 MG PO TABS: 5.0000 mg | ORAL_TABLET | Freq: Once | ORAL | Status: DC | PRN

## 2020-08-03 MED ORDER — FENTANYL CITRATE (PF) 100 MCG/2ML IJ SOLN
INTRAMUSCULAR | Status: AC
  Filled 2020-08-03: qty 2

## 2020-08-03 MED ORDER — LIDOCAINE 2% (20 MG/ML) 5 ML SYRINGE
INTRAMUSCULAR | Status: DC | PRN
  Administered 2020-08-03: 80 mg via INTRAVENOUS

## 2020-08-03 MED ORDER — MIDAZOLAM HCL 5 MG/5ML IJ SOLN
INTRAMUSCULAR | Status: DC | PRN
  Administered 2020-08-03: 2 mg via INTRAVENOUS

## 2020-08-03 MED ORDER — OXYCODONE HCL 5 MG/5ML PO SOLN: 5.0000 mg | Freq: Once | ORAL | Status: DC | PRN

## 2020-08-03 MED ORDER — PHENYLEPHRINE 40 MCG/ML (10ML) SYRINGE FOR IV PUSH (FOR BLOOD PRESSURE SUPPORT)
PREFILLED_SYRINGE | INTRAVENOUS | Status: AC
  Filled 2020-08-03: qty 10

## 2020-08-03 MED ORDER — MIDAZOLAM HCL 2 MG/2ML IJ SOLN
INTRAMUSCULAR | Status: AC
  Filled 2020-08-03: qty 2

## 2020-08-03 MED ORDER — LIDOCAINE 2% (20 MG/ML) 5 ML SYRINGE
INTRAMUSCULAR | Status: AC
  Filled 2020-08-03: qty 5

## 2020-08-03 MED FILL — HYDROCODON-APAP 5-325: 5-325 | 5 days supply | Qty: 20 | Fill #0

## 2020-08-03 SURGICAL SUPPLY — 36 items
BANDAGE ESMARK 6X9 LF (GAUZE/BANDAGES/DRESSINGS) IMPLANT
BLADE EXCALIBUR 4.0X13 (MISCELLANEOUS) ×2 IMPLANT
BLADE SHAVER TORPEDO 4X13 (MISCELLANEOUS) IMPLANT
BNDG ELASTIC 6X5.8 VLCR STR LF (GAUZE/BANDAGES/DRESSINGS) ×4 IMPLANT
BNDG ESMARK 6X9 LF (GAUZE/BANDAGES/DRESSINGS)
COOLER ICEMAN CLASSIC (MISCELLANEOUS) ×2 IMPLANT
COVER WAND RF STERILE (DRAPES) IMPLANT
CUFF TOURN SGL QUICK 34 (TOURNIQUET CUFF) ×2
CUFF TRNQT CYL 34X4.125X (TOURNIQUET CUFF) ×1 IMPLANT
DRAPE ARTHROSCOPY W/POUCH 90 (DRAPES) ×2 IMPLANT
DRAPE IMP U-DRAPE 54X76 (DRAPES) ×2 IMPLANT
DRAPE U-SHAPE 47X51 STRL (DRAPES) ×2 IMPLANT
DURAPREP 26ML APPLICATOR (WOUND CARE) ×2 IMPLANT
GAUZE SPONGE 4X4 12PLY STRL (GAUZE/BANDAGES/DRESSINGS) ×2 IMPLANT
GAUZE XEROFORM 1X8 LF (GAUZE/BANDAGES/DRESSINGS) ×2 IMPLANT
GLOVE ECLIPSE 7.0 STRL STRAW (GLOVE) ×2 IMPLANT
GLOVE SURG NEOP MICRO LF SZ7.5 (GLOVE) ×2 IMPLANT
GLOVE SURG SS PI 7.0 STRL IVOR (GLOVE) ×2 IMPLANT
GLOVE SURG SS PI 8.0 STRL IVOR (GLOVE) ×2 IMPLANT
GLOVE SURG SYN 7.5  E (GLOVE) ×2
GLOVE SURG SYN 7.5 E (GLOVE) ×1 IMPLANT
GLOVE SURG UNDER POLY LF SZ7 (GLOVE) ×6 IMPLANT
GOWN SPEC L3 XXLG W/TWL (GOWN DISPOSABLE) ×2 IMPLANT
GOWN STRL REIN XL XLG (GOWN DISPOSABLE) ×6 IMPLANT
GOWN STRL REUS W/ TWL LRG LVL3 (GOWN DISPOSABLE) ×1 IMPLANT
GOWN STRL REUS W/ TWL XL LVL3 (GOWN DISPOSABLE) IMPLANT
GOWN STRL REUS W/TWL LRG LVL3 (GOWN DISPOSABLE) ×2
GOWN STRL REUS W/TWL XL LVL3 (GOWN DISPOSABLE)
MANIFOLD NEPTUNE II (INSTRUMENTS) ×2 IMPLANT
PACK ARTHROSCOPY DSU (CUSTOM PROCEDURE TRAY) ×2 IMPLANT
PACK BASIN DAY SURGERY FS (CUSTOM PROCEDURE TRAY) ×2 IMPLANT
PAD COLD SHLDR WRAP-ON (PAD) ×2 IMPLANT
SHEET MEDIUM DRAPE 40X70 STRL (DRAPES) ×2 IMPLANT
SUT ETHILON 3 0 PS 1 (SUTURE) ×2 IMPLANT
TOWEL GREEN STERILE FF (TOWEL DISPOSABLE) ×2 IMPLANT
TUBING ARTHROSCOPY IRRIG 16FT (MISCELLANEOUS) ×2 IMPLANT

## 2020-08-03 NOTE — Anesthesia Preprocedure Evaluation (Signed)
Anesthesia Evaluation  Patient identified by MRN, date of birth, ID band Patient awake    Reviewed: Allergy & Precautions, H&P , NPO status , Patient's Chart, lab work & pertinent test results, reviewed documented beta blocker date and time   Airway Mallampati: II  TM Distance: >3 FB Neck ROM: full    Dental no notable dental hx.    Pulmonary neg pulmonary ROS,    Pulmonary exam normal breath sounds clear to auscultation       Cardiovascular Exercise Tolerance: Good hypertension, Pt. on medications negative cardio ROS   Rhythm:regular Rate:Normal     Neuro/Psych negative neurological ROS  negative psych ROS   GI/Hepatic negative GI ROS, Neg liver ROS,   Endo/Other  negative endocrine ROS  Renal/GU negative Renal ROS  negative genitourinary   Musculoskeletal  (+) Arthritis , Osteoarthritis,    Abdominal (+) + obese,   Peds  Hematology negative hematology ROS (+)   Anesthesia Other Findings   Reproductive/Obstetrics negative OB ROS                             Anesthesia Physical Anesthesia Plan  ASA: II  Anesthesia Plan: General   Post-op Pain Management:    Induction:   PONV Risk Score and Plan: 3 and Ondansetron, Dexamethasone and Treatment may vary due to age or medical condition  Airway Management Planned: Simple Face Mask and LMA  Additional Equipment:   Intra-op Plan:   Post-operative Plan:   Informed Consent: I have reviewed the patients History and Physical, chart, labs and discussed the procedure including the risks, benefits and alternatives for the proposed anesthesia with the patient or authorized representative who has indicated his/her understanding and acceptance.     Dental Advisory Given  Plan Discussed with: CRNA  Anesthesia Plan Comments:         Anesthesia Quick Evaluation

## 2020-08-03 NOTE — Transfer of Care (Signed)
Immediate Anesthesia Transfer of Care Note  Patient: Michelle Cruz  Procedure(s) Performed: LEFT KNEE ARTHROSCOPY WITH PARTIAL MEDIAL MENISCECTOMY, POSSIBLE ABRASION ARTHROPLASTY (Left Knee)  Patient Location: PACU  Anesthesia Type:General  Level of Consciousness: drowsy and patient cooperative  Airway & Oxygen Therapy: Patient Spontanous Breathing and Patient connected to face mask oxygen  Post-op Assessment: Report given to RN and Post -op Vital signs reviewed and stable  Post vital signs: Reviewed and stable  Last Vitals:  Vitals Value Taken Time  BP 102/66 08/03/20 1425  Temp    Pulse 64 08/03/20 1426  Resp 13 08/03/20 1426  SpO2 100 % 08/03/20 1426  Vitals shown include unvalidated device data.  Last Pain:  Vitals:   08/03/20 1248  TempSrc: Oral  PainSc: 9       Patients Stated Pain Goal: 7 (85/63/14 9702)  Complications: No complications documented.

## 2020-08-03 NOTE — Anesthesia Postprocedure Evaluation (Signed)
Anesthesia Post Note  Patient: Michelle Cruz  Procedure(s) Performed: LEFT KNEE ARTHROSCOPY WITH PARTIAL MEDIAL MENISCECTOMY, POSSIBLE ABRASION ARTHROPLASTY (Left Knee)     Patient location during evaluation: PACU Anesthesia Type: General Level of consciousness: awake and alert Pain management: pain level controlled Vital Signs Assessment: post-procedure vital signs reviewed and stable Respiratory status: spontaneous breathing, nonlabored ventilation, respiratory function stable and patient connected to nasal cannula oxygen Cardiovascular status: blood pressure returned to baseline and stable Postop Assessment: no apparent nausea or vomiting Anesthetic complications: no   No complications documented.  Last Vitals:  Vitals:   08/03/20 1426 08/03/20 1427  BP:    Pulse: 64 64  Resp: 14 13  Temp:    SpO2: 100% 100%    Last Pain:  Vitals:   08/03/20 1248  TempSrc: Oral  PainSc: 9                  Colby Catanese

## 2020-08-03 NOTE — Op Note (Signed)
Surgery Date: 08/03/2020  PREOPERATIVE DIAGNOSES:  1. Right knee medial meniscus tear 2. Right knee synovitis 3.  Right knee tricompartmental chondromalacia  POSTOPERATIVE DIAGNOSES:  same  PROCEDURES PERFORMED:  1.  Right knee arthroscopy with major synovectomy 2.  Right knee arthroscopy with arthroscopic partial medial meniscectomy 3.  Right knee arthroscopy with arthroscopic chondroplasty medial femoral condyle  SURGEON: N. Eduard Roux, M.D.  ASSIST: Ciro Backer Queets, [place]; necessary for the timely completion of procedure and due to complexity of procedure.  ANESTHESIA:  general  FLUIDS: Per anesthesia record.   ESTIMATED BLOOD LOSS: minimal  DESCRIPTION OF PROCEDURE: Michelle Cruz is a 70 y.o.-year-old female with above mentioned conditions. Full discussion held regarding risks benefits alternatives and complications related surgical intervention. Conservative care options reviewed. All questions answered.  The patient was identified in the preoperative holding area and the operative extremity was marked. The patient was brought to the operating room and transferred to operating table in a supine position. Satisfactory general anesthesia was induced by anesthesiology.    Standard anterolateral, anteromedial arthroscopy portals were obtained. The anteromedial portal was obtained with a spinal needle for localization under direct visualization with subsequent diagnostic findings.   Incisions were made for knee arthroscopy portals.  Diagnostic knee arthroscopy was first performed which revealed moderate synovitis throughout the knee.  Major synovectomy was performed in the gutters and the suprapatellar pouch as well as the medial and lateral compartments.  Then we placed a gentle valgus force to open up the medial compartment.  There was widespread grade 3 and grade IV chondromalacia on both the femoral and tibial sides.  Given the extent of the chondromalacia I did not feel  abrasion arthroplasty would be beneficial.  We did find a small macerated tear of the posterior horn of the medial meniscus.  There was a small amount of medial meniscus remaining from the previous surgery.  Most of it was extruded.  Partial medial meniscectomy was performed using an oscillating shaver back to a stable border.  Gentle chondroplasty was performed on the medial femoral condyle.  We then evaluated the cruciates which showed degeneration of the ACL.  The lateral compartment showed grade I chondromalacia with an intact lateral meniscus.  The knee was then placed into full extension and the patellofemoral compartment showed a central stripe in the femoral trochlea which demonstrated grade III chondromalacia.  There was a focal area at the apex of the lateral femoral condyle which was grade 4 changes.  There was synovitis in the gutters.  There was a small embedded chondral body in the soft tissues anterior to the anterior horn the lateral meniscus which was removed without difficulty.  Gutters were checked for loose bodies.  Excess fluid was removed from the knee joint.  Incisions were closed with interrupted nylon sutures.  Sterile dressings were applied.  Patient tolerated procedure well had no immediate complications.  Suprapatellar pouch and gutters: moderate synovitis or debris. Patella chondral surface: Grade 2 Trochlear chondral surface: Grade 4 Patellofemoral tracking: Slightly lateral Medial meniscus: Macerated and diminutive.  Medial femoral condyle weight bearing surface: Grade 4 Medial tibial plateau: Grade 4 Anterior cruciate ligament: Internal degeneration Posterior cruciate ligament:stable Lateral meniscus: Normal.   Lateral femoral condyle weight bearing surface: Grade 1 Lateral tibial plateau: Grade 1  DISPOSITION: The patient was awakened from general anesthetic, extubated, taken to the recovery room in medically stable condition, no apparent complications. The patient  may be weightbearing as tolerated to the operative lower extremity.  Range of motion of right knee as tolerated.  Michelle Cecil, MD [facility] 2:17 PM

## 2020-08-03 NOTE — Anesthesia Procedure Notes (Signed)
Procedure Name: LMA Insertion Date/Time: 08/03/2020 1:50 PM Performed by: Genelle Bal, CRNA Pre-anesthesia Checklist: Patient identified, Emergency Drugs available, Suction available and Patient being monitored Patient Re-evaluated:Patient Re-evaluated prior to induction Oxygen Delivery Method: Circle system utilized Preoxygenation: Pre-oxygenation with 100% oxygen Induction Type: IV induction Ventilation: Mask ventilation without difficulty LMA: LMA inserted LMA Size: 4.0 Number of attempts: 1 Airway Equipment and Method: Bite block Placement Confirmation: positive ETCO2 Tube secured with: Tape Dental Injury: Teeth and Oropharynx as per pre-operative assessment

## 2020-08-03 NOTE — Discharge Instructions (Signed)
°Post Anesthesia Home Care Instructions ° °Activity: °Get plenty of rest for the remainder of the day. A responsible individual must stay with you for 24 hours following the procedure.  °For the next 24 hours, DO NOT: °-Drive a car °-Operate machinery °-Drink alcoholic beverages °-Take any medication unless instructed by your physician °-Make any legal decisions or sign important papers. ° °Meals: °Start with liquid foods such as gelatin or soup. Progress to regular foods as tolerated. Avoid greasy, spicy, heavy foods. If nausea and/or vomiting occur, drink only clear liquids until the nausea and/or vomiting subsides. Call your physician if vomiting continues. ° °Special Instructions/Symptoms: °Your throat may feel dry or sore from the anesthesia or the breathing tube placed in your throat during surgery. If this causes discomfort, gargle with warm salt water. The discomfort should disappear within 24 hours. ° °If you had a scopolamine patch placed behind your ear for the management of post- operative nausea and/or vomiting: ° °1. The medication in the patch is effective for 72 hours, after which it should be removed.  Wrap patch in a tissue and discard in the trash. Wash hands thoroughly with soap and water. °2. You may remove the patch earlier than 72 hours if you experience unpleasant side effects which may include dry mouth, dizziness or visual disturbances. °3. Avoid touching the patch. Wash your hands with soap and water after contact with the patch. °  ° ° ° ° ° ° °Post-operative patient instructions  °Knee Arthroscopy  ° °• Ice:  Place intermittent ice or cooler pack over your knee, 30 minutes on and 30 minutes off.  Continue this for the first 72 hours after surgery, then save ice for use after therapy sessions or on more active days.   °• Weight:  You may bear weight on your leg as your symptoms allow. °• Crutches:  Use crutches (or walker) to assist in walking until told to discontinue by your physical  therapist or physician. This will help to reduce pain. °• Strengthening:  Perform simple thigh squeezes (isometric quad contractions) and straight leg lifts as you are able (3 sets of 5 to 10 repetitions, 3 times a day).  For the leg lifts, have someone support under your ankle in the beginning until you have increased strength enough to do this on your own.  To help get started on thigh squeezes, place a pillow under your knee and push down on the pillow with back of knee (sometimes easier to do than with your leg fully straight). °• Motion:  Perform gentle knee motion as tolerated - this is gentle bending and straightening of the knee. Seated heel slides: you can start by sitting in a chair, remove your brace, and gently slide your heel back on the floor - allowing your knee to bend. Have someone help you straighten your knee (or use your other leg/foot hooked under your ankle.  °• Dressing:  Perform 1st dressing change at 2 days postoperative. A moderate amount of blood tinged drainage is to be expected.  So if you bleed through the dressing on the first or second day or if you have fevers, it is fine to change the dressing/check the wounds early and redress wound. Elevate your leg.  If it bleeds through again, or if the incisions are leaking frank blood, please call the office. May change dressing every 1-2 days thereafter to help watch wounds. Can purchase Tegaderm (or 3M Nexcare) water resistant dressings at local pharmacy / [facility]. °• Shower:    Light shower is ok after 2 days.  Please take shower, NO bath. Recover with gauze and ace wrap to help keep wounds protected.   °• Pain medication:  A narcotic pain medication has been prescribed.  Take as directed.  Typically you need narcotic pain medication more regularly during the first 3 to 5 days after surgery.  Decrease your use of the medication as the pain improves.  Narcotics can sometimes cause constipation, even after a few doses.  If you have problems  with constipation, you can take an over the counter stool softener or light laxative.  If you have persistent problems, please notify your physician’s office. °• Physical therapy: Additional activity guidelines to be provided by your physician or physical therapist at follow-up visits.  °• Driving: Do not recommend driving x 2 weeks post surgical, especially if surgery performed on right side. Should not drive while taking narcotic pain medications. It typically takes at least 2 weeks to restore sufficient neuromuscular function for normal reaction times for driving safety.  °• Call [phone] for questions or problems. Evenings you will be forwarded to the hospital operator.  Ask for the orthopaedic physician on call. Please call if you experience:  °  °o Redness, foul smelling, or persistent drainage from the surgical site  °o worsening knee pain and swelling not responsive to medication  °o any calf pain and or swelling of the lower leg  °o temperatures greater than 101.5 F °o other questions or concerns ° ° °Thank you for allowing us to be a part of your care. ° °

## 2020-08-03 NOTE — H&P (Signed)
PREOPERATIVE H&P  Chief Complaint: left knee medial meniscal tear  HPI: Michelle Cruz is a 70 y.o. female who presents for surgical treatment of left knee medial meniscal tear.  She denies any changes in medical history.  Past Medical History:  Diagnosis Date  . Arthritis   . Hypertension   . VERTIGO 10/28/2008   Qualifier: Diagnosis of  By: Erin Hearing MD, Ruthann Cancer     Past Surgical History:  Procedure Laterality Date  . BREAST ENHANCEMENT SURGERY  1991  . CESAREAN SECTION  1988   Social History   Socioeconomic History  . Marital status: Divorced    Spouse name: Not on file  . Number of children: 1  . Years of education: Not on file  . Highest education level: Not on file  Occupational History  . Occupation: Medical Records     Employer: [employer]  Tobacco Use  . Smoking status: Never Smoker  . Smokeless tobacco: Never Used  Substance and Sexual Activity  . Alcohol use: Yes    Alcohol/week: 2.0 standard drinks    Types: 2 Glasses of wine per week  . Drug use: No  . Sexual activity: Not Currently    Birth control/protection: None  Other Topics Concern  . Not on file  Social History Narrative   Lives with a roomate   Social Determinants of Health   Financial Resource Strain: Not on file  Food Insecurity: Not on file  Transportation Needs: Not on file  Physical Activity: Not on file  Stress: Not on file  Social Connections: Not on file   Family History  Problem Relation Age of Onset  . Hypertension Maternal Aunt   . Diabetes Maternal Aunt   . Cancer Neg Hx   . Colon cancer Neg Hx   . Esophageal cancer Neg Hx   . Pancreatic cancer Neg Hx   . Rectal cancer Neg Hx   . Stomach cancer Neg Hx    No Known Allergies Prior to Admission medications   Medication Sig Start Date End Date Taking? Authorizing Provider  Cetirizine HCl (ZYRTEC ALLERGY PO) Take by mouth.   Yes [provider]  ibuprofen (ADVIL,MOTRIN) 200 MG tablet Take 200 mg by  mouth every 6 (six) hours as needed.   Yes [provider]  LISINOPRIL PO Take by mouth.   Yes [provider]  Vitamin E 100 units TABS Take by mouth.   Yes [provider]  Clobetasol Propionate 0.05 % shampoo Apply 1 application topically 2 (two) times a week. 10/29/16   Wynona Luna, MD  diclofenac sodium (VOLTAREN) 1 % GEL Apply 2 g topically 4 (four) times daily. 02/11/17   Leandrew Koyanagi, MD  hydrocortisone 1 % ointment Apply 1 application topically 2 (two) times daily. 10/27/16   Wynona Luna, MD     Positive ROS: All other systems have been reviewed and were otherwise negative with the exception of those mentioned in the HPI and as above.  Physical Exam: General: Alert, no acute distress Cardiovascular: No pedal edema Respiratory: No cyanosis, no use of accessory musculature GI: abdomen soft Skin: No lesions in the area of chief complaint Neurologic: Sensation intact distally Psychiatric: Patient is competent for consent with normal mood and affect Lymphatic: no lymphedema  MUSCULOSKELETAL: exam stable  Assessment: left knee medial meniscal tear  Plan: Plan for Procedure(s): LEFT KNEE ARTHROSCOPY WITH PARTIAL MEDIAL MENISCECTOMY, POSSIBLE ABRASION ARTHROPLASTY  The risks benefits and alternatives were discussed with the  patient including but not limited to the risks of nonoperative treatment, versus surgical intervention including infection, bleeding, nerve injury,  blood clots, cardiopulmonary complications, morbidity, mortality, among others, and they were willing to proceed.   Preoperative templating of the joint replacement has been completed, documented, and submitted to the Operating Room personnel in order to optimize intra-operative equipment management.   Eduard Roux, MD 08/03/2020 1:39 PM

## 2020-08-04 ENCOUNTER — Encounter (HOSPITAL_BASED_OUTPATIENT_CLINIC_OR_DEPARTMENT_OTHER): Payer: Self-pay | Admitting: Orthopaedic Surgery

## 2020-08-10 ENCOUNTER — Telehealth: Payer: Self-pay

## 2020-08-10 ENCOUNTER — Ambulatory Visit (INDEPENDENT_AMBULATORY_CARE_PROVIDER_SITE_OTHER): Payer: 59 | Admitting: Physician Assistant

## 2020-08-10 ENCOUNTER — Encounter: Payer: Self-pay | Admitting: Orthopaedic Surgery

## 2020-08-10 DIAGNOSIS — Z9889 Other specified postprocedural states: Secondary | ICD-10-CM

## 2020-08-10 NOTE — Progress Notes (Signed)
   Post-Op Visit Note   Patient: Michelle Cruz           Date of Birth: [birth year]           MRN: [id number] Visit Date: 08/10/2020 PCP: Velna Hatchet, MD   Assessment & Plan:  Chief Complaint:  Chief Complaint  Patient presents with  . Left Knee - Routine Post Op   Visit Diagnoses:  1. S/P arthroscopy of left knee     Plan: Patient is a pleasant 70 year old female who comes in today 1 week out left knee arthroscopic debridement medial meniscus and chondroplasty medial femoral condyle.  It was noted during operative intervention that she had grade 4 changes to the trochlea and medial compartment.  She has been doing well.  She is ambulating in public with crutches but ambulates at home without assistance.  She notes some soreness and tightness but no significant pain.  She is taking over-the-counter pain medication for this.  Examination of the left knee reveals well-healed surgical portals with nylon sutures in place.  No evidence of infection or cellulitis.  Calf is soft nontender.  Today, sutures were removed and Steri-Strips applied.  Intraoperative pictures reviewed.  We have provided the patient with a home exercise program.  She will follow-up with [place] in 5 weeks time for repeat evaluation.  Call with concerns or questions.  Follow-Up Instructions: Return in about 5 weeks (around 09/14/2020).   Orders:  No orders of the defined types were placed in this encounter.  No orders of the defined types were placed in this encounter.   Imaging: No new imaging  PMFS History: Patient Active Problem List   Diagnosis Date Noted  . Acute medial meniscus tear, left, initial encounter 08/03/2020  . Degenerative arthritis of left knee 08/03/2020  . Chronic pain of left knee 02/11/2017  . Impaired fasting glucose 11/30/2011    Class: Acute  . Hypercholesteremia 11/30/2011    Class: Chronic  . Elevated BP 11/22/2011  . Weight gain 11/22/2011  . MENOPAUSAL SYNDROME 09/19/2006    Past Medical History:  Diagnosis Date  . Arthritis   . Hypertension   . VERTIGO 10/28/2008   Qualifier: Diagnosis of  By: Erin Hearing MD, Ruthann Cancer      Family History  Problem Relation Age of Onset  . Hypertension Maternal Aunt   . Diabetes Maternal Aunt   . Cancer Neg Hx   . Colon cancer Neg Hx   . Esophageal cancer Neg Hx   . Pancreatic cancer Neg Hx   . Rectal cancer Neg Hx   . Stomach cancer Neg Hx     Past Surgical History:  Procedure Laterality Date  . BREAST ENHANCEMENT SURGERY  1991  . CESAREAN SECTION  1988  . KNEE ARTHROSCOPY WITH MEDIAL MENISECTOMY Left 08/03/2020   Procedure: LEFT KNEE ARTHROSCOPY WITH PARTIAL MEDIAL MENISCECTOMY, SYNOVECTOMY AND CHONDROPLASTY MEDIAL FEMORAL CONDYLE;  Surgeon: Leandrew Koyanagi, MD;  Location: [facility];  Service: Orthopedics;  Laterality: Left;   Social History   Occupational History  . Occupation: Medical Records     Employer: [employer]  Tobacco Use  . Smoking status: Never Smoker  . Smokeless tobacco: Never Used  Substance and Sexual Activity  . Alcohol use: Yes    Alcohol/week: 2.0 standard drinks    Types: 2 Glasses of wine per week  . Drug use: No  . Sexual activity: Not Currently    Birth control/protection: None

## 2020-08-10 NOTE — Telephone Encounter (Signed)
Faxed Op Note to fax number [phone]  Claim number [id number]

## 2020-09-14 ENCOUNTER — Ambulatory Visit (INDEPENDENT_AMBULATORY_CARE_PROVIDER_SITE_OTHER): Payer: 59 | Admitting: Physician Assistant

## 2020-09-14 ENCOUNTER — Encounter: Payer: Self-pay | Admitting: Orthopaedic Surgery

## 2020-09-14 DIAGNOSIS — Z9889 Other specified postprocedural states: Secondary | ICD-10-CM

## 2020-09-14 NOTE — Progress Notes (Signed)
   Post-Op Visit Note   Patient: Michelle Cruz           Date of Birth: [birth year]           MRN: [id number] Visit Date: 09/14/2020 PCP: Velna Hatchet, MD   Assessment & Plan:  Chief Complaint:  Chief Complaint  Patient presents with  . Left Knee - Pain   Visit Diagnoses:  1. S/P left knee arthroscopy     Plan: Patient is a pleasant 70 year old female who comes in today 6 weeks out left knee arthroscopic debridement medial meniscus as well as chondroplasty medial femoral condyle.  It was noted during operative mention that she had grade 4 changes to the medial compartment and trochlea.  She has been doing much better.  She still notes soreness especially with increased activity for which she did takes an anti-inflammatory at night.  Otherwise, no complaints.  Examination of her left knee reveals range of motion from 0 to 125 degrees.  She is neurovascular intact distally.  At this point, she will continue to advance with activity as tolerated.  She will follow up with [place] as needed.  Work note provided to return full duty without restrictions this coming Monday.  Follow-Up Instructions: Return if symptoms worsen or fail to improve.   Orders:  No orders of the defined types were placed in this encounter.  No orders of the defined types were placed in this encounter.   Imaging: No new imaging  PMFS History: Patient Active Problem List   Diagnosis Date Noted  . Acute medial meniscus tear, left, initial encounter 08/03/2020  . Degenerative arthritis of left knee 08/03/2020  . Chronic pain of left knee 02/11/2017  . Impaired fasting glucose 11/30/2011    Class: Acute  . Hypercholesteremia 11/30/2011    Class: Chronic  . Elevated BP 11/22/2011  . Weight gain 11/22/2011  . MENOPAUSAL SYNDROME 09/19/2006   Past Medical History:  Diagnosis Date  . Arthritis   . Hypertension   . VERTIGO 10/28/2008   Qualifier: Diagnosis of  By: Erin Hearing MD, Ruthann Cancer      Family  History  Problem Relation Age of Onset  . Hypertension Maternal Aunt   . Diabetes Maternal Aunt   . Cancer Neg Hx   . Colon cancer Neg Hx   . Esophageal cancer Neg Hx   . Pancreatic cancer Neg Hx   . Rectal cancer Neg Hx   . Stomach cancer Neg Hx     Past Surgical History:  Procedure Laterality Date  . BREAST ENHANCEMENT SURGERY  1991  . CESAREAN SECTION  1988  . KNEE ARTHROSCOPY WITH MEDIAL MENISECTOMY Left 08/03/2020   Procedure: LEFT KNEE ARTHROSCOPY WITH PARTIAL MEDIAL MENISCECTOMY, SYNOVECTOMY AND CHONDROPLASTY MEDIAL FEMORAL CONDYLE;  Surgeon: Leandrew Koyanagi, MD;  Location: [facility];  Service: Orthopedics;  Laterality: Left;   Social History   Occupational History  . Occupation: Medical Records     Employer: [employer]  Tobacco Use  . Smoking status: Never Smoker  . Smokeless tobacco: Never Used  Substance and Sexual Activity  . Alcohol use: Yes    Alcohol/week: 2.0 standard drinks    Types: 2 Glasses of wine per week  . Drug use: No  . Sexual activity: Not Currently    Birth control/protection: None

## 2020-09-16 ENCOUNTER — Other Ambulatory Visit (HOSPITAL_COMMUNITY): Payer: Self-pay | Admitting: Internal Medicine

## 2020-09-16 DIAGNOSIS — M25562 Pain in left knee: Secondary | ICD-10-CM | POA: Diagnosis not present

## 2020-09-16 DIAGNOSIS — I10 Essential (primary) hypertension: Secondary | ICD-10-CM | POA: Diagnosis not present

## 2020-09-16 DIAGNOSIS — E785 Hyperlipidemia, unspecified: Secondary | ICD-10-CM | POA: Diagnosis not present

## 2020-09-16 DIAGNOSIS — E1169 Type 2 diabetes mellitus with other specified complication: Secondary | ICD-10-CM | POA: Diagnosis not present

## 2020-09-16 MED FILL — ROSUVASTATIN CALCIUM 5 MG T: 5 | 84 days supply | Qty: 36 | Fill #0

## 2020-10-24 ENCOUNTER — Other Ambulatory Visit (HOSPITAL_COMMUNITY): Payer: Self-pay

## 2020-10-24 MED FILL — Lisinopril & Hydrochlorothiazide Tab 20-12.5 MG: ORAL | 90 days supply | Qty: 90 | Fill #0 | Status: AC

## 2020-12-08 ENCOUNTER — Other Ambulatory Visit (HOSPITAL_COMMUNITY): Payer: Self-pay

## 2020-12-08 MED FILL — Rosuvastatin Calcium Tab 5 MG: ORAL | 86 days supply | Qty: 37 | Fill #0 | Status: AC

## 2021-01-25 ENCOUNTER — Other Ambulatory Visit (HOSPITAL_COMMUNITY): Payer: Self-pay

## 2021-01-26 ENCOUNTER — Other Ambulatory Visit (HOSPITAL_COMMUNITY): Payer: Self-pay

## 2021-01-26 MED ORDER — LISINOPRIL-HYDROCHLOROTHIAZIDE 20-12.5 MG PO TABS
ORAL_TABLET | ORAL | 3 refills | Status: DC
  Filled 2021-01-26: qty 90, 90d supply, fill #0
  Filled 2021-04-26: qty 90, 90d supply, fill #1
  Filled 2021-07-27: qty 90, 90d supply, fill #2
  Filled 2021-11-01: qty 90, 90d supply, fill #3

## 2021-02-01 ENCOUNTER — Other Ambulatory Visit (HOSPITAL_COMMUNITY): Payer: Self-pay

## 2021-02-28 ENCOUNTER — Other Ambulatory Visit (HOSPITAL_COMMUNITY): Payer: Self-pay

## 2021-02-28 MED FILL — Rosuvastatin Calcium Tab 5 MG: ORAL | 86 days supply | Qty: 37 | Fill #1 | Status: AC

## 2021-03-30 ENCOUNTER — Other Ambulatory Visit (HOSPITAL_COMMUNITY): Payer: Self-pay

## 2021-03-30 DIAGNOSIS — R0981 Nasal congestion: Secondary | ICD-10-CM | POA: Diagnosis not present

## 2021-03-30 DIAGNOSIS — J069 Acute upper respiratory infection, unspecified: Secondary | ICD-10-CM | POA: Diagnosis not present

## 2021-03-30 DIAGNOSIS — Z1152 Encounter for screening for COVID-19: Secondary | ICD-10-CM | POA: Diagnosis not present

## 2021-03-30 DIAGNOSIS — R5383 Other fatigue: Secondary | ICD-10-CM | POA: Diagnosis not present

## 2021-03-30 DIAGNOSIS — R058 Other specified cough: Secondary | ICD-10-CM | POA: Diagnosis not present

## 2021-03-30 MED ORDER — AZITHROMYCIN 250 MG PO TABS
ORAL_TABLET | ORAL | 0 refills | Status: AC
  Filled 2021-03-30: qty 6, 5d supply, fill #0

## 2021-04-03 DIAGNOSIS — R051 Acute cough: Secondary | ICD-10-CM | POA: Diagnosis not present

## 2021-04-03 DIAGNOSIS — Z1152 Encounter for screening for COVID-19: Secondary | ICD-10-CM | POA: Diagnosis not present

## 2021-04-25 ENCOUNTER — Other Ambulatory Visit (HOSPITAL_COMMUNITY): Payer: Self-pay

## 2021-04-26 ENCOUNTER — Other Ambulatory Visit (HOSPITAL_COMMUNITY): Payer: Self-pay

## 2021-04-27 ENCOUNTER — Other Ambulatory Visit (HOSPITAL_COMMUNITY): Payer: Self-pay

## 2021-04-27 MED ORDER — LISINOPRIL-HYDROCHLOROTHIAZIDE 20-12.5 MG PO TABS
1.0000 | ORAL_TABLET | Freq: Every day | ORAL | 3 refills | Status: AC
  Filled 2021-04-27: qty 90, 90d supply, fill #0

## 2021-05-22 DIAGNOSIS — E785 Hyperlipidemia, unspecified: Secondary | ICD-10-CM | POA: Diagnosis not present

## 2021-05-22 DIAGNOSIS — E1169 Type 2 diabetes mellitus with other specified complication: Secondary | ICD-10-CM | POA: Diagnosis not present

## 2021-05-22 DIAGNOSIS — E559 Vitamin D deficiency, unspecified: Secondary | ICD-10-CM | POA: Diagnosis not present

## 2021-05-26 ENCOUNTER — Other Ambulatory Visit (HOSPITAL_COMMUNITY): Payer: Self-pay

## 2021-05-26 DIAGNOSIS — M7630 Iliotibial band syndrome, unspecified leg: Secondary | ICD-10-CM | POA: Diagnosis not present

## 2021-05-26 DIAGNOSIS — I10 Essential (primary) hypertension: Secondary | ICD-10-CM | POA: Diagnosis not present

## 2021-05-26 DIAGNOSIS — E785 Hyperlipidemia, unspecified: Secondary | ICD-10-CM | POA: Diagnosis not present

## 2021-05-26 DIAGNOSIS — Z23 Encounter for immunization: Secondary | ICD-10-CM | POA: Diagnosis not present

## 2021-05-26 DIAGNOSIS — E559 Vitamin D deficiency, unspecified: Secondary | ICD-10-CM | POA: Diagnosis not present

## 2021-05-26 DIAGNOSIS — Z1331 Encounter for screening for depression: Secondary | ICD-10-CM | POA: Diagnosis not present

## 2021-05-26 DIAGNOSIS — Z0000 Encounter for general adult medical examination without abnormal findings: Secondary | ICD-10-CM | POA: Diagnosis not present

## 2021-05-26 DIAGNOSIS — E1169 Type 2 diabetes mellitus with other specified complication: Secondary | ICD-10-CM | POA: Diagnosis not present

## 2021-05-26 DIAGNOSIS — M1710 Unilateral primary osteoarthritis, unspecified knee: Secondary | ICD-10-CM | POA: Diagnosis not present

## 2021-05-26 DIAGNOSIS — L309 Dermatitis, unspecified: Secondary | ICD-10-CM | POA: Diagnosis not present

## 2021-05-26 DIAGNOSIS — Z1339 Encounter for screening examination for other mental health and behavioral disorders: Secondary | ICD-10-CM | POA: Diagnosis not present

## 2021-05-26 MED ORDER — ROSUVASTATIN CALCIUM 5 MG PO TABS
ORAL_TABLET | ORAL | 3 refills | Status: AC
  Filled 2021-05-26: qty 40, 90d supply, fill #0
  Filled 2021-09-04: qty 40, 90d supply, fill #1

## 2021-05-26 MED ORDER — LISINOPRIL-HYDROCHLOROTHIAZIDE 20-12.5 MG PO TABS
1.0000 | ORAL_TABLET | Freq: Every day | ORAL | 4 refills | Status: AC
  Filled 2021-05-26 – 2022-04-30 (×2): qty 90, 90d supply, fill #0

## 2021-05-29 ENCOUNTER — Other Ambulatory Visit: Payer: Self-pay | Admitting: Internal Medicine

## 2021-05-29 DIAGNOSIS — Z1231 Encounter for screening mammogram for malignant neoplasm of breast: Secondary | ICD-10-CM

## 2021-07-07 ENCOUNTER — Other Ambulatory Visit (HOSPITAL_COMMUNITY): Payer: Self-pay

## 2021-07-07 DIAGNOSIS — I10 Essential (primary) hypertension: Secondary | ICD-10-CM | POA: Diagnosis not present

## 2021-07-07 DIAGNOSIS — R051 Acute cough: Secondary | ICD-10-CM | POA: Diagnosis not present

## 2021-07-07 DIAGNOSIS — J069 Acute upper respiratory infection, unspecified: Secondary | ICD-10-CM | POA: Diagnosis not present

## 2021-07-07 MED ORDER — AZITHROMYCIN 250 MG PO TABS
ORAL_TABLET | ORAL | 0 refills | Status: AC
Start: 2021-07-07 — End: ?
  Filled 2021-07-07: qty 6, 5d supply, fill #0

## 2021-07-12 ENCOUNTER — Ambulatory Visit: Payer: 59

## 2021-07-13 ENCOUNTER — Ambulatory Visit: Payer: 59

## 2021-07-27 ENCOUNTER — Other Ambulatory Visit (HOSPITAL_COMMUNITY): Payer: Self-pay

## 2021-08-14 ENCOUNTER — Ambulatory Visit
Admission: RE | Admit: 2021-08-14 | Discharge: 2021-08-14 | Disposition: A | Discharge status: Home / Self Care | Payer: 59 | Source: Ambulatory Visit | Attending: Internal Medicine | Admitting: Internal Medicine

## 2021-08-14 DIAGNOSIS — Z1231 Encounter for screening mammogram for malignant neoplasm of breast: Secondary | ICD-10-CM

## 2021-09-04 ENCOUNTER — Other Ambulatory Visit (HOSPITAL_COMMUNITY): Payer: Self-pay

## 2021-09-28 DIAGNOSIS — H524 Presbyopia: Secondary | ICD-10-CM | POA: Diagnosis not present

## 2021-09-29 ENCOUNTER — Other Ambulatory Visit (HOSPITAL_COMMUNITY): Payer: Self-pay

## 2021-09-29 DIAGNOSIS — M79672 Pain in left foot: Secondary | ICD-10-CM | POA: Diagnosis not present

## 2021-09-29 MED ORDER — PREDNISONE 10 MG (21) PO TBPK
ORAL_TABLET | ORAL | 0 refills | Status: AC
  Filled 2021-09-29: qty 21, 6d supply, fill #0

## 2021-11-01 ENCOUNTER — Other Ambulatory Visit (HOSPITAL_COMMUNITY): Payer: Self-pay

## 2021-11-22 ENCOUNTER — Other Ambulatory Visit (HOSPITAL_COMMUNITY): Payer: Self-pay

## 2021-11-22 DIAGNOSIS — E1169 Type 2 diabetes mellitus with other specified complication: Secondary | ICD-10-CM | POA: Diagnosis not present

## 2021-11-22 DIAGNOSIS — M109 Gout, unspecified: Secondary | ICD-10-CM | POA: Diagnosis not present

## 2021-11-22 DIAGNOSIS — E785 Hyperlipidemia, unspecified: Secondary | ICD-10-CM | POA: Diagnosis not present

## 2021-11-22 DIAGNOSIS — L309 Dermatitis, unspecified: Secondary | ICD-10-CM | POA: Diagnosis not present

## 2021-11-22 DIAGNOSIS — M7630 Iliotibial band syndrome, unspecified leg: Secondary | ICD-10-CM | POA: Diagnosis not present

## 2021-11-22 DIAGNOSIS — M1710 Unilateral primary osteoarthritis, unspecified knee: Secondary | ICD-10-CM | POA: Diagnosis not present

## 2021-11-22 DIAGNOSIS — I10 Essential (primary) hypertension: Secondary | ICD-10-CM | POA: Diagnosis not present

## 2021-11-22 MED ORDER — METFORMIN HCL 500 MG PO TABS
ORAL_TABLET | ORAL | 1 refills | Status: DC
  Filled 2021-11-22: qty 180, 90d supply, fill #0
  Filled 2022-03-01: qty 180, 90d supply, fill #1

## 2021-11-22 MED ORDER — ROSUVASTATIN CALCIUM 5 MG PO TABS
ORAL_TABLET | ORAL | 3 refills | Status: AC
  Filled 2021-11-22: qty 40, 90d supply, fill #0

## 2022-01-25 ENCOUNTER — Other Ambulatory Visit (HOSPITAL_COMMUNITY): Payer: Self-pay

## 2022-01-26 ENCOUNTER — Other Ambulatory Visit (HOSPITAL_COMMUNITY): Payer: Self-pay

## 2022-01-26 MED ORDER — LISINOPRIL-HYDROCHLOROTHIAZIDE 20-12.5 MG PO TABS
1.0000 | ORAL_TABLET | Freq: Every day | ORAL | 0 refills | Status: DC
  Filled 2022-01-26: qty 90, 90d supply, fill #0

## 2022-02-01 IMAGING — MR MR KNEE*L* W/O CM
4 of 6 series · 21 of 40 positions shown · non-contrast
Comparison: Left knee x-rays dated February 11, 2017.

CLINICAL DATA: Chronic medial left knee pain. No injury or prior
surgery.

EXAM:
MRI OF THE LEFT KNEE WITHOUT CONTRAST
TECHNIQUE: Multiplanar, multisequence MR imaging of the knee was performed. No
intravenous contrast was administered.

[Series 3: T2 fat-sat · axial · 4.0mm · 0.50mm/px · z∈[-69,+66]mm · 4 of 28 slices shown (1 of 2)]
[im 1/28]
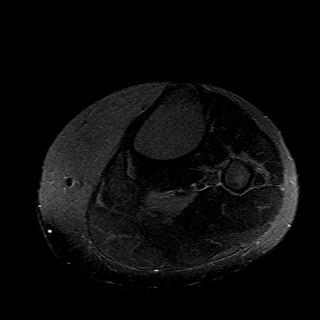
[im 6/28]
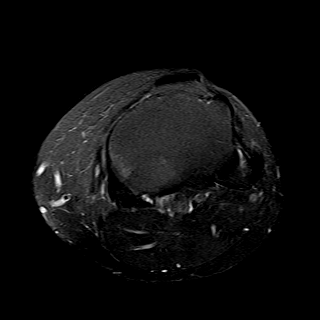
[im 17/28]
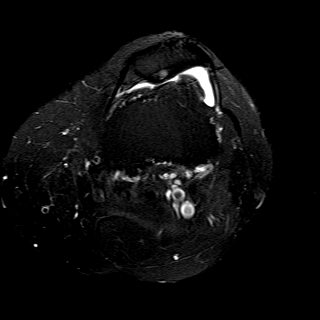
[im 28/28]
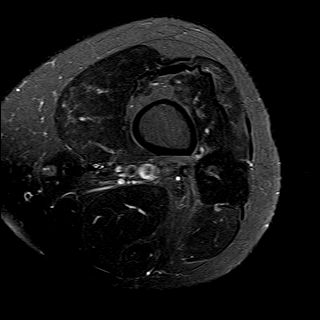

[Series 5: T2 fat-sat · coronal · 4.0mm · 0.29mm/px · 3 of 22 slices shown (2 of 2)]
[im 5/22]
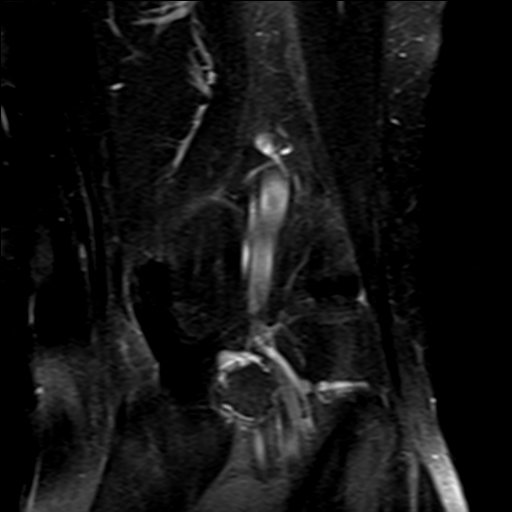
[im 13/22]
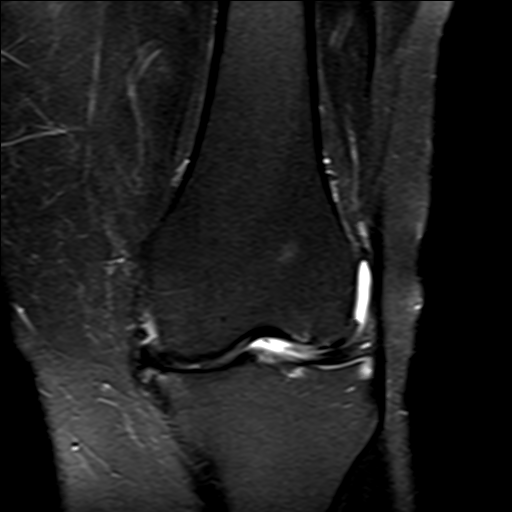
[im 22/22]
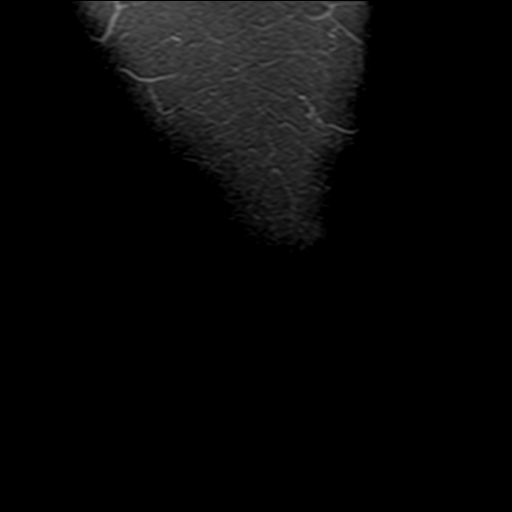

[Series 7: PD fat-sat · sagittal · 3.0mm · 0.29mm/px · 8 of 32 slices shown (1 of 2)]
[im 1/32]
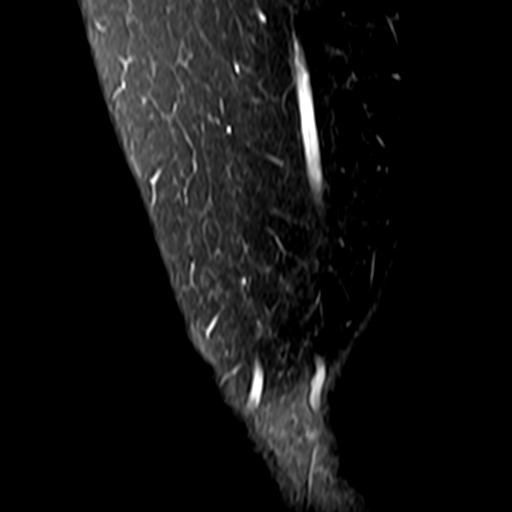
[im 5/32]
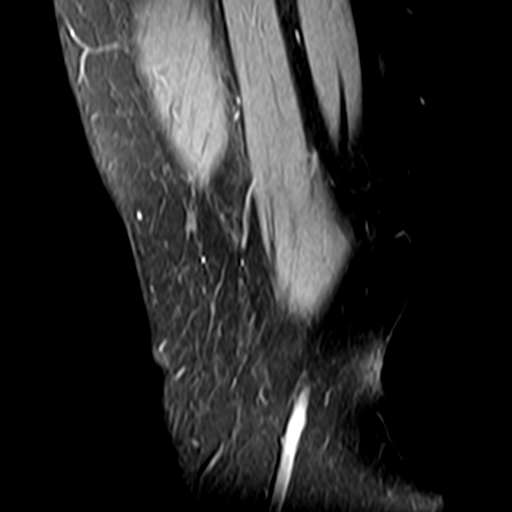
[im 9/32]
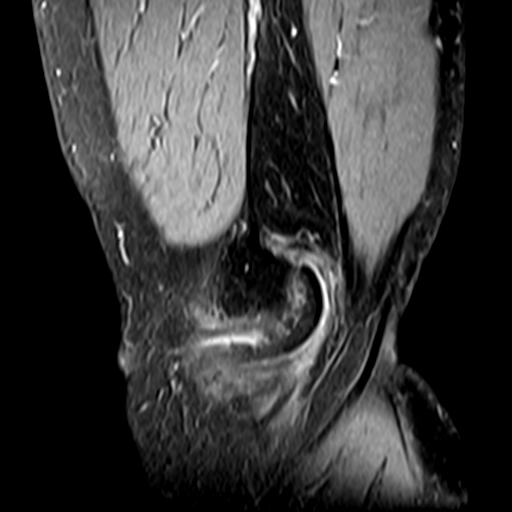
[im 14/32]
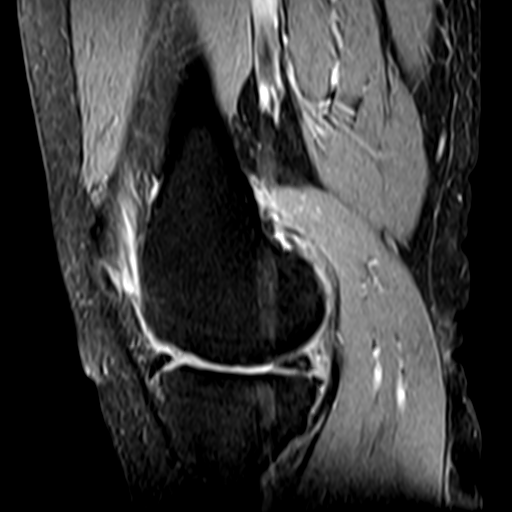
[im 18/32]
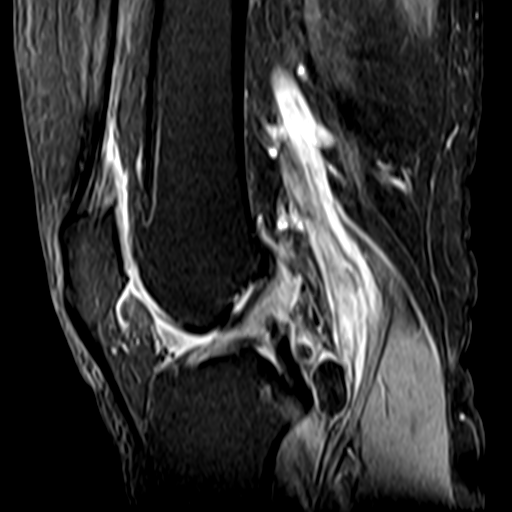
[im 23/32]
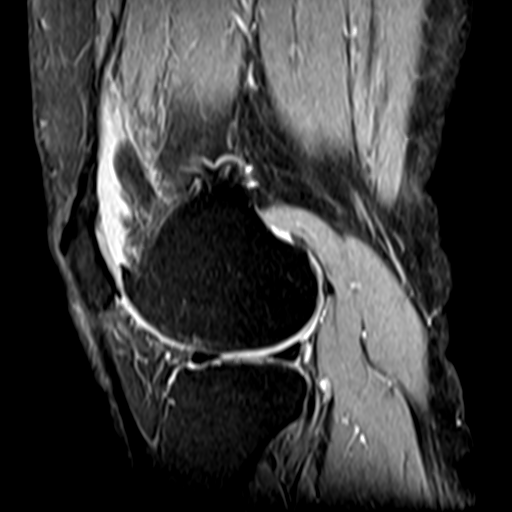
[im 27/32]
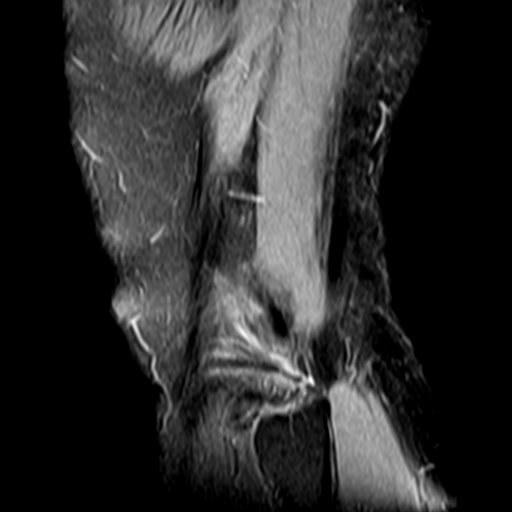
[im 32/32]
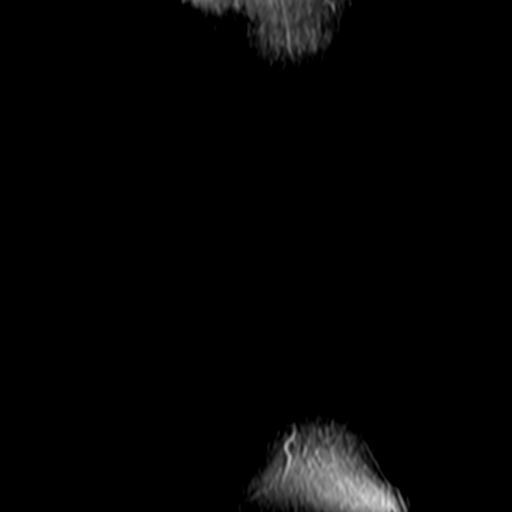

[Series 8: PD fat-sat · coronal · 4.0mm · 0.29mm/px · 6 of 22 slices shown (2 of 2)]
[im 1/22]
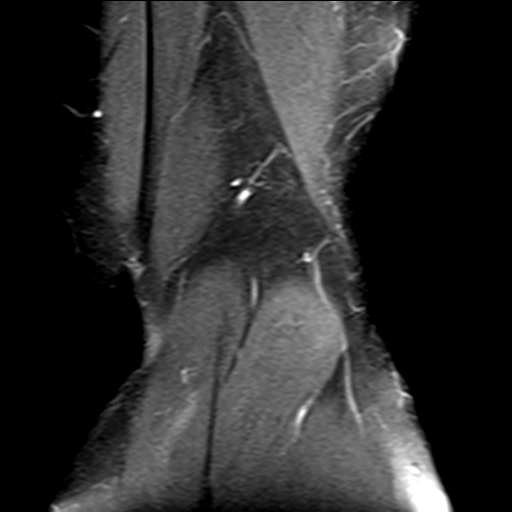
[im 5/22]
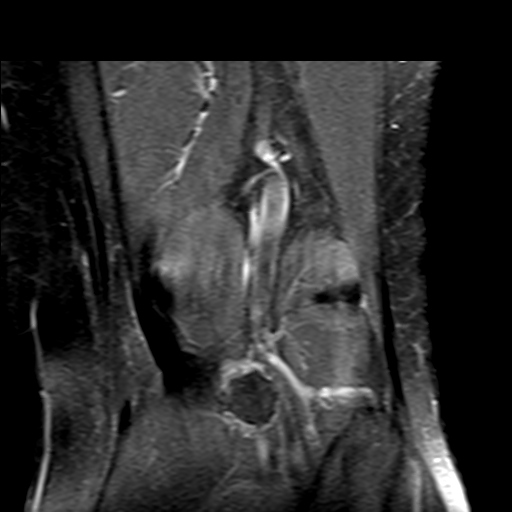
[im 9/22]
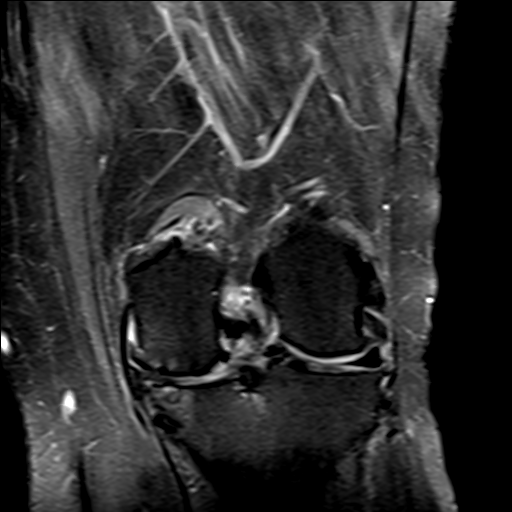
[im 13/22]
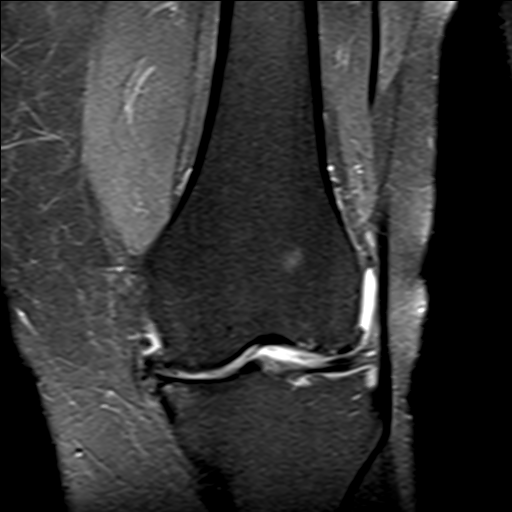
[im 17/22]
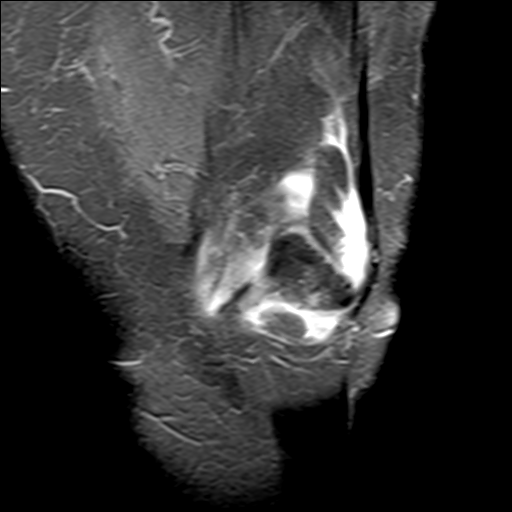
[im 22/22]
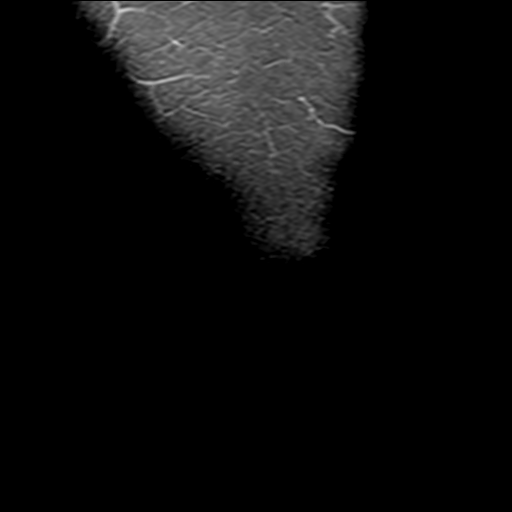

[21 of 40 positions shown; findings below may reference images not displayed]

FINDINGS: MENISCI

Medial meniscus: Diffusely degenerated. Diminutive and macerated
body. Small undersurface tear at the body/posterior horn junction.

Lateral meniscus:  Intact.

LIGAMENTS

Cruciates:  Intact ACL and PCL.

Collaterals: Medial collateral ligament is intact. Lateral
collateral ligament complex is intact.

CARTILAGE

Patellofemoral: Moderate partial-thickness cartilage loss over the
patella. Absent cartilage over the lateral trochlea.

Medial: Large areas of full-thickness cartilage loss over the
central weight-bearing medial femoral condyle and medial tibial
plateau.

Lateral: Diffuse significant cartilage thinning without focal
defect.

Joint: Small joint effusion. Multiple intra-articular bodies
posterior to the PCL, measuring up to 1.9 cm. These were present on
the prior x-ray. Normal Hoffa's fat.

Popliteal Fossa:  No Baker cyst. Intact popliteus tendon.

Extensor Mechanism: Intact quadriceps tendon and patellar tendon.
Intact medial and lateral patellar retinaculum. Intact MPFL.

Bones: No acute fracture or dislocation. No suspicious bone lesion.
Mild tricompartmental degenerative subchondral marrow edema.
Tricompartmental marginal osteophytes.

Other: None.
IMPRESSION: 1. Tricompartmental osteoarthritis, moderate in the medial and
patellofemoral compartments.
2. Diminutive and macerated medial meniscus body. Small undersurface
tear at the body/posterior horn junction.
3. Small joint effusion with multiple intra-articular bodies.

## 2022-02-23 ENCOUNTER — Other Ambulatory Visit (HOSPITAL_COMMUNITY): Payer: Self-pay

## 2022-02-23 DIAGNOSIS — I10 Essential (primary) hypertension: Secondary | ICD-10-CM | POA: Diagnosis not present

## 2022-02-23 DIAGNOSIS — E559 Vitamin D deficiency, unspecified: Secondary | ICD-10-CM | POA: Diagnosis not present

## 2022-02-23 DIAGNOSIS — E785 Hyperlipidemia, unspecified: Secondary | ICD-10-CM | POA: Diagnosis not present

## 2022-02-23 DIAGNOSIS — E1169 Type 2 diabetes mellitus with other specified complication: Secondary | ICD-10-CM | POA: Diagnosis not present

## 2022-02-23 DIAGNOSIS — M25519 Pain in unspecified shoulder: Secondary | ICD-10-CM | POA: Diagnosis not present

## 2022-02-23 DIAGNOSIS — Z1339 Encounter for screening examination for other mental health and behavioral disorders: Secondary | ICD-10-CM | POA: Diagnosis not present

## 2022-02-23 MED ORDER — ROSUVASTATIN CALCIUM 5 MG PO TABS
ORAL_TABLET | ORAL | 3 refills | Status: AC
  Filled 2022-02-23: qty 40, 90d supply, fill #0

## 2022-03-01 ENCOUNTER — Other Ambulatory Visit (HOSPITAL_COMMUNITY): Payer: Self-pay

## 2022-04-30 ENCOUNTER — Other Ambulatory Visit (HOSPITAL_COMMUNITY): Payer: Self-pay

## 2022-05-25 DIAGNOSIS — E785 Hyperlipidemia, unspecified: Secondary | ICD-10-CM | POA: Diagnosis not present

## 2022-05-25 DIAGNOSIS — I10 Essential (primary) hypertension: Secondary | ICD-10-CM | POA: Diagnosis not present

## 2022-05-25 DIAGNOSIS — R7989 Other specified abnormal findings of blood chemistry: Secondary | ICD-10-CM | POA: Diagnosis not present

## 2022-05-25 DIAGNOSIS — M109 Gout, unspecified: Secondary | ICD-10-CM | POA: Diagnosis not present

## 2022-05-25 DIAGNOSIS — E1169 Type 2 diabetes mellitus with other specified complication: Secondary | ICD-10-CM | POA: Diagnosis not present

## 2022-05-25 DIAGNOSIS — E559 Vitamin D deficiency, unspecified: Secondary | ICD-10-CM | POA: Diagnosis not present

## 2022-05-31 ENCOUNTER — Other Ambulatory Visit (HOSPITAL_COMMUNITY): Payer: Self-pay

## 2022-05-31 DIAGNOSIS — Z1331 Encounter for screening for depression: Secondary | ICD-10-CM | POA: Diagnosis not present

## 2022-05-31 DIAGNOSIS — E1169 Type 2 diabetes mellitus with other specified complication: Secondary | ICD-10-CM | POA: Diagnosis not present

## 2022-05-31 DIAGNOSIS — I10 Essential (primary) hypertension: Secondary | ICD-10-CM | POA: Diagnosis not present

## 2022-05-31 DIAGNOSIS — Z0000 Encounter for general adult medical examination without abnormal findings: Secondary | ICD-10-CM | POA: Diagnosis not present

## 2022-05-31 DIAGNOSIS — E785 Hyperlipidemia, unspecified: Secondary | ICD-10-CM | POA: Diagnosis not present

## 2022-05-31 DIAGNOSIS — Z1339 Encounter for screening examination for other mental health and behavioral disorders: Secondary | ICD-10-CM | POA: Diagnosis not present

## 2022-05-31 DIAGNOSIS — E559 Vitamin D deficiency, unspecified: Secondary | ICD-10-CM | POA: Diagnosis not present

## 2022-05-31 DIAGNOSIS — Z23 Encounter for immunization: Secondary | ICD-10-CM | POA: Diagnosis not present

## 2022-05-31 MED ORDER — ROSUVASTATIN CALCIUM 5 MG PO TABS
5.0000 mg | ORAL_TABLET | ORAL | 3 refills | Status: DC
  Filled 2022-05-31: qty 36, 84d supply, fill #0
  Filled 2022-08-28: qty 36, 84d supply, fill #1
  Filled 2022-11-27: qty 36, 84d supply, fill #2
  Filled 2023-02-18: qty 36, 84d supply, fill #3
  Filled 2023-05-07: qty 16, 37d supply, fill #4

## 2022-06-12 ENCOUNTER — Other Ambulatory Visit (HOSPITAL_COMMUNITY): Payer: Self-pay

## 2022-06-12 MED ORDER — METFORMIN HCL 500 MG PO TABS
500.0000 mg | ORAL_TABLET | Freq: Two times a day (BID) | ORAL | 1 refills | Status: DC
  Filled 2022-06-12: qty 180, 90d supply, fill #0
  Filled 2022-10-09 (×2): qty 180, 90d supply, fill #1

## 2022-06-13 ENCOUNTER — Other Ambulatory Visit (HOSPITAL_COMMUNITY): Payer: Self-pay

## 2022-08-03 ENCOUNTER — Other Ambulatory Visit (HOSPITAL_COMMUNITY): Payer: Self-pay

## 2022-08-03 MED ORDER — LISINOPRIL-HYDROCHLOROTHIAZIDE 20-12.5 MG PO TABS
1.0000 | ORAL_TABLET | Freq: Every day | ORAL | 3 refills | Status: DC
  Filled 2022-08-03: qty 90, 90d supply, fill #0
  Filled 2022-10-23: qty 90, 90d supply, fill #1
  Filled 2023-02-05: qty 90, 90d supply, fill #2
  Filled 2023-05-07: qty 90, 90d supply, fill #3

## 2022-10-09 ENCOUNTER — Other Ambulatory Visit (HOSPITAL_COMMUNITY): Payer: Self-pay

## 2022-10-09 ENCOUNTER — Other Ambulatory Visit: Payer: Self-pay

## 2022-11-30 DIAGNOSIS — I10 Essential (primary) hypertension: Secondary | ICD-10-CM | POA: Diagnosis not present

## 2022-11-30 DIAGNOSIS — E785 Hyperlipidemia, unspecified: Secondary | ICD-10-CM | POA: Diagnosis not present

## 2022-11-30 DIAGNOSIS — E1169 Type 2 diabetes mellitus with other specified complication: Secondary | ICD-10-CM | POA: Diagnosis not present

## 2023-02-21 ENCOUNTER — Other Ambulatory Visit (HOSPITAL_COMMUNITY): Payer: Self-pay

## 2023-03-29 ENCOUNTER — Other Ambulatory Visit (HOSPITAL_COMMUNITY): Payer: Self-pay

## 2023-03-29 MED ORDER — METFORMIN HCL 500 MG PO TABS
500.0000 mg | ORAL_TABLET | Freq: Every evening | ORAL | 3 refills | Status: DC
  Filled 2023-03-29: qty 90, 90d supply, fill #0
  Filled 2023-07-08: qty 90, 90d supply, fill #1
  Filled 2023-10-09: qty 90, 90d supply, fill #2
  Filled 2024-01-08: qty 90, 90d supply, fill #3

## 2023-04-01 ENCOUNTER — Other Ambulatory Visit (HOSPITAL_COMMUNITY): Payer: Self-pay

## 2023-05-07 ENCOUNTER — Other Ambulatory Visit: Payer: Self-pay

## 2023-05-08 ENCOUNTER — Other Ambulatory Visit (HOSPITAL_COMMUNITY): Payer: Self-pay

## 2023-06-18 ENCOUNTER — Other Ambulatory Visit (HOSPITAL_COMMUNITY): Payer: Self-pay

## 2023-06-19 ENCOUNTER — Other Ambulatory Visit (HOSPITAL_COMMUNITY): Payer: Self-pay

## 2023-06-19 MED ORDER — ROSUVASTATIN CALCIUM 5 MG PO TABS
5.0000 mg | ORAL_TABLET | Freq: Every day | ORAL | 3 refills | Status: AC
  Filled 2023-06-19: qty 40, 90d supply, fill #0
  Filled 2023-09-24: qty 40, 90d supply, fill #1
  Filled 2023-12-30: qty 40, 90d supply, fill #2
  Filled 2024-03-16: qty 40, 90d supply, fill #3

## 2023-06-28 DIAGNOSIS — I10 Essential (primary) hypertension: Secondary | ICD-10-CM | POA: Diagnosis not present

## 2023-06-28 DIAGNOSIS — M109 Gout, unspecified: Secondary | ICD-10-CM | POA: Diagnosis not present

## 2023-06-28 DIAGNOSIS — E1169 Type 2 diabetes mellitus with other specified complication: Secondary | ICD-10-CM | POA: Diagnosis not present

## 2023-06-28 DIAGNOSIS — E559 Vitamin D deficiency, unspecified: Secondary | ICD-10-CM | POA: Diagnosis not present

## 2023-06-28 DIAGNOSIS — E785 Hyperlipidemia, unspecified: Secondary | ICD-10-CM | POA: Diagnosis not present

## 2023-07-05 DIAGNOSIS — I10 Essential (primary) hypertension: Secondary | ICD-10-CM | POA: Diagnosis not present

## 2023-07-05 DIAGNOSIS — Z1331 Encounter for screening for depression: Secondary | ICD-10-CM | POA: Diagnosis not present

## 2023-07-05 DIAGNOSIS — E559 Vitamin D deficiency, unspecified: Secondary | ICD-10-CM | POA: Diagnosis not present

## 2023-07-05 DIAGNOSIS — Z0000 Encounter for general adult medical examination without abnormal findings: Secondary | ICD-10-CM | POA: Diagnosis not present

## 2023-07-05 DIAGNOSIS — Z1339 Encounter for screening examination for other mental health and behavioral disorders: Secondary | ICD-10-CM | POA: Diagnosis not present

## 2023-07-05 DIAGNOSIS — E785 Hyperlipidemia, unspecified: Secondary | ICD-10-CM | POA: Diagnosis not present

## 2023-07-05 DIAGNOSIS — E1169 Type 2 diabetes mellitus with other specified complication: Secondary | ICD-10-CM | POA: Diagnosis not present

## 2023-08-05 DIAGNOSIS — H524 Presbyopia: Secondary | ICD-10-CM | POA: Diagnosis not present

## 2023-08-13 ENCOUNTER — Other Ambulatory Visit (HOSPITAL_COMMUNITY): Payer: Self-pay

## 2023-08-13 MED ORDER — LISINOPRIL-HYDROCHLOROTHIAZIDE 20-12.5 MG PO TABS
1.0000 | ORAL_TABLET | Freq: Every day | ORAL | 3 refills | Status: DC
  Filled 2023-08-13: qty 90, 90d supply, fill #0
  Filled 2023-11-11: qty 90, 90d supply, fill #1
  Filled 2024-02-12: qty 90, 90d supply, fill #2
  Filled 2024-05-19: qty 90, 90d supply, fill #3

## 2023-08-22 DIAGNOSIS — H2511 Age-related nuclear cataract, right eye: Secondary | ICD-10-CM | POA: Diagnosis not present

## 2023-12-31 ENCOUNTER — Other Ambulatory Visit (HOSPITAL_COMMUNITY): Payer: Self-pay

## 2024-01-28 ENCOUNTER — Other Ambulatory Visit (HOSPITAL_COMMUNITY): Payer: Self-pay

## 2024-01-28 ENCOUNTER — Other Ambulatory Visit: Payer: Self-pay

## 2024-01-28 DIAGNOSIS — E785 Hyperlipidemia, unspecified: Secondary | ICD-10-CM | POA: Diagnosis not present

## 2024-01-28 DIAGNOSIS — R42 Dizziness and giddiness: Secondary | ICD-10-CM | POA: Diagnosis not present

## 2024-01-28 DIAGNOSIS — E1169 Type 2 diabetes mellitus with other specified complication: Secondary | ICD-10-CM | POA: Diagnosis not present

## 2024-01-28 DIAGNOSIS — I10 Essential (primary) hypertension: Secondary | ICD-10-CM | POA: Diagnosis not present

## 2024-01-28 DIAGNOSIS — E559 Vitamin D deficiency, unspecified: Secondary | ICD-10-CM | POA: Diagnosis not present

## 2024-01-28 MED ORDER — MECLIZINE HCL 12.5 MG PO TABS
12.5000 mg | ORAL_TABLET | Freq: Two times a day (BID) | ORAL | 0 refills | Status: AC | PRN
  Filled 2024-01-28: qty 30, 15d supply, fill #0

## 2024-02-13 ENCOUNTER — Other Ambulatory Visit (HOSPITAL_COMMUNITY): Payer: Self-pay

## 2024-04-14 ENCOUNTER — Other Ambulatory Visit (HOSPITAL_COMMUNITY): Payer: Self-pay

## 2024-04-14 MED ORDER — METFORMIN HCL 500 MG PO TABS
500.0000 mg | ORAL_TABLET | Freq: Every evening | ORAL | 3 refills | Status: AC
  Filled 2024-04-14: qty 90, 90d supply, fill #0

## 2024-07-08 ENCOUNTER — Other Ambulatory Visit (HOSPITAL_COMMUNITY): Payer: Self-pay

## 2024-07-08 ENCOUNTER — Other Ambulatory Visit: Payer: Self-pay

## 2024-07-08 MED ORDER — ROSUVASTATIN CALCIUM 5 MG PO TABS
5.0000 mg | ORAL_TABLET | ORAL | 3 refills | Status: AC
  Filled 2024-07-08: qty 40, 90d supply, fill #0

## 2024-07-08 MED ORDER — METFORMIN HCL 500 MG PO TABS
500.0000 mg | ORAL_TABLET | Freq: Every evening | ORAL | 3 refills | Status: AC
  Filled 2024-07-08: qty 90, 90d supply, fill #0

## 2024-08-26 ENCOUNTER — Other Ambulatory Visit (HOSPITAL_COMMUNITY): Payer: Self-pay

## 2024-08-26 MED ORDER — LISINOPRIL-HYDROCHLOROTHIAZIDE 20-12.5 MG PO TABS
1.0000 | ORAL_TABLET | Freq: Every day | ORAL | 3 refills | Status: AC
  Filled 2024-08-26: qty 90, 90d supply, fill #0
